# Patient Record
Sex: Female | Born: 1945 | Race: Asian | Hispanic: No | Marital: Married | Smoking: Never smoker
Health system: Southern US, Community
[De-identification: ages and names within clinical notes are randomized; demographics above are authoritative.]

## PROBLEM LIST (undated history)

## (undated) DIAGNOSIS — I10 Essential (primary) hypertension: Secondary | ICD-10-CM

## (undated) DIAGNOSIS — M1990 Unspecified osteoarthritis, unspecified site: Secondary | ICD-10-CM

## (undated) HISTORY — PX: NO PAST SURGERIES: SHX2092

---

## 2020-05-23 DIAGNOSIS — I639 Cerebral infarction, unspecified: Secondary | ICD-10-CM

## 2020-05-23 HISTORY — DX: Cerebral infarction, unspecified: I63.9

## 2020-06-12 DIAGNOSIS — I160 Hypertensive urgency: Secondary | ICD-10-CM

## 2020-06-12 DIAGNOSIS — I4510 Unspecified right bundle-branch block: Secondary | ICD-10-CM

## 2020-06-12 DIAGNOSIS — G459 Transient cerebral ischemic attack, unspecified: Secondary | ICD-10-CM

## 2020-06-12 DIAGNOSIS — I361 Nonrheumatic tricuspid (valve) insufficiency: Secondary | ICD-10-CM

## 2020-06-14 ENCOUNTER — Observation Stay (HOSPITAL_COMMUNITY)
Admission: EM | Admit: 2020-06-14 | Discharge: 2020-06-15 | DRG: 066 | Disposition: A | Discharge status: Home / Self Care | Payer: Medicaid Other | Source: Other Acute Inpatient Hospital | Attending: Internal Medicine | Admitting: Internal Medicine

## 2020-06-14 DIAGNOSIS — I6322 Cerebral infarction due to unspecified occlusion or stenosis of basilar arteries: Secondary | ICD-10-CM | POA: Diagnosis present

## 2020-06-14 DIAGNOSIS — R7303 Prediabetes: Secondary | ICD-10-CM | POA: Diagnosis not present

## 2020-06-14 DIAGNOSIS — I10 Essential (primary) hypertension: Secondary | ICD-10-CM

## 2020-06-14 DIAGNOSIS — Z8249 Family history of ischemic heart disease and other diseases of the circulatory system: Secondary | ICD-10-CM

## 2020-06-14 DIAGNOSIS — E785 Hyperlipidemia, unspecified: Secondary | ICD-10-CM | POA: Diagnosis present

## 2020-06-14 DIAGNOSIS — I4510 Unspecified right bundle-branch block: Secondary | ICD-10-CM | POA: Diagnosis not present

## 2020-06-14 DIAGNOSIS — I672 Cerebral atherosclerosis: Secondary | ICD-10-CM | POA: Diagnosis present

## 2020-06-14 DIAGNOSIS — R29700 NIHSS score 0: Secondary | ICD-10-CM | POA: Diagnosis not present

## 2020-06-14 DIAGNOSIS — Z20822 Contact with and (suspected) exposure to covid-19: Secondary | ICD-10-CM | POA: Diagnosis not present

## 2020-06-14 DIAGNOSIS — I651 Occlusion and stenosis of basilar artery: Secondary | ICD-10-CM

## 2020-06-14 DIAGNOSIS — I639 Cerebral infarction, unspecified: Secondary | ICD-10-CM

## 2020-06-14 HISTORY — DX: Unspecified osteoarthritis, unspecified site: M19.90

## 2020-06-14 HISTORY — DX: Essential (primary) hypertension: I10

## 2020-06-14 NOTE — Progress Notes (Signed)
Patient arrived in the unit at 2223 pm from [facility] ,escorted by [facility] staffs in stretcher, alert and oriented x4.

## 2020-06-14 NOTE — Progress Notes (Signed)
Patient passed [facility] stroke swallow eval.

## 2020-06-15 ENCOUNTER — Encounter (HOSPITAL_COMMUNITY): Payer: Self-pay | Admitting: Internal Medicine

## 2020-06-15 ENCOUNTER — Inpatient Hospital Stay (HOSPITAL_COMMUNITY): Payer: Medicaid Other

## 2020-06-15 DIAGNOSIS — I6322 Cerebral infarction due to unspecified occlusion or stenosis of basilar arteries: Secondary | ICD-10-CM | POA: Diagnosis present

## 2020-06-15 DIAGNOSIS — I10 Essential (primary) hypertension: Secondary | ICD-10-CM

## 2020-06-15 DIAGNOSIS — Z20822 Contact with and (suspected) exposure to covid-19: Secondary | ICD-10-CM | POA: Diagnosis not present

## 2020-06-15 DIAGNOSIS — I651 Occlusion and stenosis of basilar artery: Secondary | ICD-10-CM

## 2020-06-15 DIAGNOSIS — R7303 Prediabetes: Secondary | ICD-10-CM | POA: Diagnosis not present

## 2020-06-15 DIAGNOSIS — Z8249 Family history of ischemic heart disease and other diseases of the circulatory system: Secondary | ICD-10-CM | POA: Diagnosis not present

## 2020-06-15 DIAGNOSIS — E7800 Pure hypercholesterolemia, unspecified: Secondary | ICD-10-CM

## 2020-06-15 DIAGNOSIS — I639 Cerebral infarction, unspecified: Secondary | ICD-10-CM

## 2020-06-15 DIAGNOSIS — E785 Hyperlipidemia, unspecified: Secondary | ICD-10-CM | POA: Diagnosis not present

## 2020-06-15 DIAGNOSIS — I672 Cerebral atherosclerosis: Secondary | ICD-10-CM | POA: Diagnosis not present

## 2020-06-15 DIAGNOSIS — I4510 Unspecified right bundle-branch block: Secondary | ICD-10-CM | POA: Diagnosis not present

## 2020-06-15 DIAGNOSIS — R29700 NIHSS score 0: Secondary | ICD-10-CM | POA: Diagnosis not present

## 2020-06-15 HISTORY — PX: IR US GUIDE VASC ACCESS RIGHT: IMG2390

## 2020-06-15 HISTORY — PX: IR ANGIO VERTEBRAL SEL SUBCLAVIAN INNOMINATE BILAT MOD SED: IMG5366

## 2020-06-15 HISTORY — PX: IR ANGIO VERTEBRAL SEL SUBCLAVIAN INNOMINATE UNI L MOD SED: IMG5364

## 2020-06-15 HISTORY — PX: IR ANGIO INTRA EXTRACRAN SEL COM CAROTID INNOMINATE BILAT MOD SED: IMG5360

## 2020-06-15 LAB — CBC WITH DIFFERENTIAL/PLATELET
Abs Immature Granulocytes: 0.02 10*3/uL (ref 0.00–0.07)
Basophils Absolute: 0.1 10*3/uL (ref 0.0–0.1)
Basophils Relative: 1 %
Eosinophils Absolute: 0.3 10*3/uL (ref 0.0–0.5)
Eosinophils Relative: 3 %
HCT: 41.3 % (ref 36.0–46.0)
Hemoglobin: 13.5 g/dL (ref 12.0–15.0)
Immature Granulocytes: 0 %
Lymphocytes Relative: 27 %
Lymphs Abs: 2.4 10*3/uL (ref 0.7–4.0)
MCH: 30.3 pg (ref 26.0–34.0)
MCHC: 32.7 g/dL (ref 30.0–36.0)
MCV: 92.6 fL (ref 80.0–100.0)
Monocytes Absolute: 0.7 10*3/uL (ref 0.1–1.0)
Monocytes Relative: 8 %
Neutro Abs: 5.4 10*3/uL (ref 1.7–7.7)
Neutrophils Relative %: 61 %
Platelets: 211 10*3/uL (ref 150–400)
RBC: 4.46 MIL/uL (ref 3.87–5.11)
RDW: 12.2 % (ref 11.5–15.5)
WBC: 8.9 10*3/uL (ref 4.0–10.5)
nRBC: 0 % (ref 0.0–0.2)

## 2020-06-15 LAB — HEMOGLOBIN A1C
Hgb A1c MFr Bld: 5.9 % — ABNORMAL HIGH (ref 4.8–5.6)
Mean Plasma Glucose: 122.63 mg/dL

## 2020-06-15 LAB — COMPREHENSIVE METABOLIC PANEL
ALT: 17 U/L (ref 0–44)
AST: 17 U/L (ref 15–41)
Albumin: 3.5 g/dL (ref 3.5–5.0)
Alkaline Phosphatase: 80 U/L (ref 38–126)
Anion gap: 10 (ref 5–15)
BUN: 8 mg/dL (ref 8–23)
CO2: 24 mmol/L (ref 22–32)
Calcium: 9.2 mg/dL (ref 8.9–10.3)
Chloride: 106 mmol/L (ref 98–111)
Creatinine, Ser: 0.66 mg/dL (ref 0.44–1.00)
GFR calc Af Amer: >60 mL/min (ref >60)
GFR calc non Af Amer: >60 mL/min (ref >60)
Glucose, Bld: 113 mg/dL — ABNORMAL HIGH (ref 70–99)
Potassium: 3.5 mmol/L (ref 3.5–5.1)
Sodium: 140 mmol/L (ref 135–145)
Total Bilirubin: 0.8 mg/dL (ref 0.3–1.2)
Total Protein: 7.3 g/dL (ref 6.5–8.1)

## 2020-06-15 LAB — SURGICAL PCR SCREEN
MRSA, PCR: NEGATIVE
Staphylococcus aureus: NEGATIVE

## 2020-06-15 LAB — LIPID PANEL
Cholesterol: 191 mg/dL (ref 0–200)
HDL: 47 mg/dL (ref >40)
LDL Cholesterol: 99 mg/dL (ref 0–99)
Total CHOL/HDL Ratio: 4.1 RATIO
Triglycerides: 223 mg/dL — ABNORMAL HIGH (ref <150)
VLDL: 45 mg/dL — ABNORMAL HIGH (ref 0–40)

## 2020-06-15 LAB — PROTIME-INR
INR: 1 (ref 0.8–1.2)
Prothrombin Time: 12.7 seconds (ref 11.4–15.2)

## 2020-06-15 LAB — SARS CORONAVIRUS 2 BY RT PCR (HOSPITAL ORDER, PERFORMED IN ~~LOC~~ HOSPITAL LAB): SARS Coronavirus 2: NEGATIVE

## 2020-06-15 MED ORDER — METOPROLOL TARTRATE 25 MG PO TABS
25.0000 mg | ORAL_TABLET | Freq: Two times a day (BID) | ORAL | Status: DC
  Administered 2020-06-15: 25 mg via ORAL
  Filled 2020-06-15: qty 1

## 2020-06-15 MED ORDER — SODIUM CHLORIDE 0.9 % IV SOLN: INTRAVENOUS | Status: DC

## 2020-06-15 MED ORDER — STROKE: EARLY STAGES OF RECOVERY BOOK
Freq: Once | Status: AC
  Filled 2020-06-15: qty 1

## 2020-06-15 MED ORDER — ACETAMINOPHEN 650 MG RE SUPP: 650.0000 mg | RECTAL | Status: DC | PRN

## 2020-06-15 MED ORDER — NITROGLYCERIN 1 MG/10 ML FOR IR/CATH LAB
INTRA_ARTERIAL | Status: AC
  Filled 2020-06-15: qty 10

## 2020-06-15 MED ORDER — VERAPAMIL HCL 2.5 MG/ML IV SOLN: INTRA_ARTERIAL | Status: AC | PRN

## 2020-06-15 MED ORDER — ATORVASTATIN CALCIUM 80 MG PO TABS: 80.0000 mg | ORAL_TABLET | Freq: Every day | ORAL | 3 refills | Status: DC

## 2020-06-15 MED ORDER — MIDAZOLAM HCL 2 MG/2ML IJ SOLN
INTRAMUSCULAR | Status: AC
  Filled 2020-06-15: qty 2

## 2020-06-15 MED ORDER — CLOPIDOGREL BISULFATE 75 MG PO TABS
300.0000 mg | ORAL_TABLET | Freq: Once | ORAL | Status: AC
  Administered 2020-06-15: 300 mg via ORAL
  Filled 2020-06-15: qty 4

## 2020-06-15 MED ORDER — ASPIRIN EC 325 MG PO TBEC: 325.0000 mg | DELAYED_RELEASE_TABLET | Freq: Every day | ORAL | Status: DC

## 2020-06-15 MED ORDER — LIDOCAINE HCL 1 % IJ SOLN
INTRAMUSCULAR | Status: AC
  Filled 2020-06-15: qty 20

## 2020-06-15 MED ORDER — ASPIRIN 325 MG PO TBEC: 325.0000 mg | DELAYED_RELEASE_TABLET | Freq: Every day | ORAL | 3 refills | Status: DC

## 2020-06-15 MED ORDER — HEPARIN SODIUM (PORCINE) 1000 UNIT/ML IJ SOLN
INTRAMUSCULAR | Status: AC
  Filled 2020-06-15: qty 1

## 2020-06-15 MED ORDER — CLOPIDOGREL BISULFATE 75 MG PO TABS: 75.0000 mg | ORAL_TABLET | Freq: Every day | ORAL | Status: DC

## 2020-06-15 MED ORDER — ATORVASTATIN CALCIUM 80 MG PO TABS
80.0000 mg | ORAL_TABLET | Freq: Every day | ORAL | Status: DC
  Administered 2020-06-15: 80 mg via ORAL
  Filled 2020-06-15: qty 1

## 2020-06-15 MED ORDER — SODIUM CHLORIDE 0.9 % IV SOLN: INTRAVENOUS | Status: AC

## 2020-06-15 MED ORDER — IOHEXOL 300 MG/ML  SOLN
50.0000 mL | Freq: Once | INTRAMUSCULAR | Status: AC | PRN
  Administered 2020-06-15: 4 mL via INTRA_ARTERIAL

## 2020-06-15 MED ORDER — ACETAMINOPHEN 325 MG PO TABS: 650.0000 mg | ORAL_TABLET | ORAL | Status: DC | PRN

## 2020-06-15 MED ORDER — FENTANYL CITRATE (PF) 100 MCG/2ML IJ SOLN
INTRAMUSCULAR | Status: AC
  Filled 2020-06-15: qty 2

## 2020-06-15 MED ORDER — ASPIRIN EC 81 MG PO TBEC: 81.0000 mg | DELAYED_RELEASE_TABLET | Freq: Every day | ORAL | Status: DC

## 2020-06-15 MED ORDER — CLOPIDOGREL BISULFATE 75 MG PO TABS: 75.0000 mg | ORAL_TABLET | Freq: Every day | ORAL | 0 refills | Status: AC

## 2020-06-15 MED ORDER — MIDAZOLAM HCL 2 MG/2ML IJ SOLN
INTRAMUSCULAR | Status: AC | PRN
  Administered 2020-06-15: 1 mg via INTRAVENOUS

## 2020-06-15 MED ORDER — FENTANYL CITRATE (PF) 100 MCG/2ML IJ SOLN
INTRAMUSCULAR | Status: AC | PRN
  Administered 2020-06-15: 25 ug via INTRAVENOUS

## 2020-06-15 MED ORDER — HYDRALAZINE HCL 20 MG/ML IJ SOLN: 10.0000 mg | INTRAMUSCULAR | Status: DC | PRN

## 2020-06-15 MED ORDER — VERAPAMIL HCL 2.5 MG/ML IV SOLN
INTRAVENOUS | Status: AC
  Filled 2020-06-15: qty 2

## 2020-06-15 MED ORDER — ASPIRIN EC 325 MG PO TBEC
325.0000 mg | DELAYED_RELEASE_TABLET | Freq: Every day | ORAL | Status: DC
  Administered 2020-06-15: 325 mg via ORAL
  Filled 2020-06-15: qty 1

## 2020-06-15 MED ORDER — ENOXAPARIN SODIUM 40 MG/0.4ML ~~LOC~~ SOLN
40.0000 mg | Freq: Every day | SUBCUTANEOUS | Status: DC
  Filled 2020-06-15: qty 0.4

## 2020-06-15 MED ORDER — ACETAMINOPHEN 160 MG/5ML PO SOLN: 650.0000 mg | ORAL | Status: DC | PRN

## 2020-06-15 MED ORDER — IOHEXOL 300 MG/ML  SOLN
150.0000 mL | Freq: Once | INTRAMUSCULAR | Status: AC | PRN
  Administered 2020-06-15: 80 mL via INTRA_ARTERIAL

## 2020-06-15 MED ORDER — LIDOCAINE HCL (PF) 1 % IJ SOLN
INTRAMUSCULAR | Status: AC | PRN
  Administered 2020-06-15: 10 mL

## 2020-06-15 NOTE — Progress Notes (Signed)
TR band removed and clean, sterile dsg applied per order and protocol. Pt VSS. Will continue to closely monitor. Pt education completed with pt daughter at bedside. PLeotis Pain Awad Gladd RN   06/15/20 1600  Vitals  Temp 98 F (36.7 C)  Temp Source Oral  BP (!) 144/73  MAP (mmHg) 93  BP Location Left Arm  BP Method Automatic  Patient Position (if appropriate) Lying  Pulse Rate 77  Pulse Rate Source Dinamap  Resp 18  Level of Consciousness  Level of Consciousness Alert  Oxygen Therapy  SpO2 98 %  O2 Device Room Air  MEWS Score  MEWS Temp 0  MEWS Systolic 0  MEWS Pulse 0  MEWS RR 0  MEWS LOC 0  MEWS Score 0  MEWS Score Color Green

## 2020-06-15 NOTE — Progress Notes (Signed)
r wrist assessed at bedside with RN

## 2020-06-15 NOTE — Discharge Summary (Signed)
Physician Discharge Summary   Patient ID: Andrea Maxwell MRN: [id number] DOB/AGE: 1946/12/08 74 y.o.  Admit date: 06/14/2020 Discharge date: 06/15/2020  Primary Care Physician:  No primary care provider on file.   Recommendations for Outpatient Follow-up:  1. Follow up with PCP in 1-2 weeks 2. Outpatient follow-up with neurology in 4 weeks, ambulatory referral sent 3. Per neurology, Dr. Roda Shutters, patient has scheduled angioplasty +/- stenting next Tuesday on 06/20/2020 4. Continue aspirin, Plavix for 3 months then aspirin alone indefinitely  Home Health: Patient back to her baseline, seen by PT Equipment/Devices:   Discharge Condition: stable  CODE STATUS: FULL   Diet recommendation: Heart healthy diet   Discharge Diagnoses:    . 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 Essential hypertension High-grade basilar artery stenosis Hyperlipidemia   Consults:   Neurology, stroke service, Dr. Roda Shutters Neuro interventional radiology, Dr. Sharin Grave   Allergies:  No Known Allergies   DISCHARGE MEDICATIONS: Allergies as of 06/15/2020   No Known Allergies     Medication List    TAKE these medications   aspirin 325 MG EC tablet Take 1 tablet (325 mg total) by mouth daily. Start taking on: June 16, 2020   atorvastatin 80 MG tablet Commonly known as: LIPITOR Take 1 tablet (80 mg total) by mouth daily. Start taking on: June 16, 2020   clopidogrel 75 MG tablet Commonly known as: PLAVIX Take 1 tablet (75 mg total) by mouth daily. Start taking on: June 16, 2020   FISH OIL PO Take 1 capsule by mouth in the morning and at bedtime.   lisinopril 20 MG tablet Commonly known as: ZESTRIL Take 20 mg by mouth daily.   metoprolol tartrate 25 MG tablet Commonly known as: LOPRESSOR Take 25 mg by mouth 2 (two) times daily.            Discharge Care Instructions  (From admission, onward)         Start     Ordered   06/15/20 0000  If the dressing is still  on your incision site when you go home, remove it on the third day after your surgery date. Remove dressing if it begins to fall off, or if it is dirty or damaged before the third day.        06/15/20 1523           Brief H and P: For complete details please refer to admission H and P, but in brief *Andrea Maxwell a 73 y.o.femalewithhistory of hypertension prediabetes who was admitted to [facility] on June 12, 2020 2 days ago for stroke work-up. Patient at that time had symptoms of perioral numbness. Per report patient had come back from her yard and while having dinner with the family started developing perioral numbness and became unresponsive with staring spells. The whole incident lasted for 5 minutes. Has had a similar incident last year. Patient was brought to the ER and was found to be nonfocal CT head was unremarkable subsequent MRI brain showed right cerebellar acute/subacute infarct. Carotid Doppler was unremarkable 2D echo showed normal EF. EKG was showing normal sinus rhythm with RBBB. CBC metabolic panel were unremarkable I do not have the access to patient's lipid panel and hemoglobin A1c. Subsequent which patient had a CT angiogram of the head showed no significant hemodynamic stenosis in the CT angiogram of the neck but did show multifocal stenosis intracranially. This was discussed with neurologist at [facility] and was planning to transfer to [facility]  for possible intervention. At the time of my exam patient appears nonfocal still complains of perioral numbness. Patient was placed on aspirin Plavix and statins at [facility] Course:   Acute CVA (cerebrovascular accident) [facility]) -Patient presented to [facility] [address] with perioral numbness, unresponsive with staring spells. -MRI brain showed right cerebellar acute/subacute infarct. -Carotid Dopplers, unremarkable, 2D echo showed normal EF, no LV thrombus and no defect in the ED interatrial or  interventricular septum -CT angiogram head and neck showed high-grade basilar stenosis. Patient was transferred to [facility] for neuro interventional evaluation -Neurology consulted, patient was started on aspirin, Plavix, statin at [facility],  -LDL 99, hemoglobin A1c 5.9 -Neurology recommended aspirin 325 mg daily and Plavix 75 mg daily after loading dose, continue dual antiplatelet agents for 3 months then aspirin alone -Patient was seen by PT, she is back to her baseline, no PT needed -There was a question of seizure at the time of presentation, EEG at [facility] was normal -Per neurology, this is felt to be due to BB insufficiency and not seizure, Keppra was started at [facility], now off  High-grade basilar artery stenosis CTA head and neck proximal basilar high-grade focal stenosis Cerebral angiogram showed proximal be a preocclusive stenosis Continue aspirin and Plavix, plan for angioplasty with possible stenting next Tuesday by Dr. Corliss Skains Patient was cleared by neurology to be discharged home and return for her procedure on Tuesday     Essential hypertension -BP currently stable, continue metoprolol, lisinopril  Hyperlipidemia Continue statin, LDL goal <70  Day of Discharge S: No acute complaints.  Overall improving.  Cleared by neurology to be discharged home  BP 123/78 (BP Location: Left Arm)   Pulse 78   Temp 98.1 F (36.7 C) (Oral)   Resp 18   SpO2 98%   Physical Exam: General: Alert and awake oriented x3 not in any acute distress. HEENT: anicteric sclera, pupils reactive to light and accommodation CVS: S1-S2 clear no murmur rubs or gallops Chest: clear to auscultation bilaterally, no wheezing rales or rhonchi Abdomen: soft nontender, nondistended, normal bowel sounds Extremities: no cyanosis, clubbing or edema noted bilaterally Neuro: No new deficits    Get Medicines reviewed and adjusted: Please take all your medications with you for your  next visit with your Primary MD  Please request your Primary MD to go over all hospital tests and procedure/radiological results at the follow up. Please ask your Primary MD to get all Hospital records sent to his/her office.  If you experience worsening of your admission symptoms, develop shortness of breath, life threatening emergency, suicidal or homicidal thoughts you must seek medical attention immediately by calling 911 or calling your MD immediately  if symptoms less severe.  You must read complete instructions/literature along with all the possible adverse reactions/side effects for all the Medicines you take and that have been prescribed to you. Take any new Medicines after you have completely understood and accept all the possible adverse reactions/side effects.   Do not drive when taking pain medications.   Do not take more than prescribed Pain, Sleep and Anxiety Medications  Special Instructions: If you have smoked or chewed Tobacco  in the last 2 yrs please stop smoking, stop any regular Alcohol  and or any Recreational drug use.  Wear Seat belts while driving.  Please note  You were cared for by a hospitalist during your hospital stay. Once you are discharged, your primary care physician will handle any further medical issues. Please note that  NO REFILLS for any discharge medications will be authorized once you are discharged, as it is imperative that you return to your primary care physician (or establish a relationship with a primary care physician if you do not have one) for your aftercare needs so that they can reassess your need for medications and monitor your lab values.   The results of significant diagnostics from this hospitalization (including imaging, microbiology, ancillary and laboratory) are listed below for reference.      Procedures/Studies:   No results found.    LAB RESULTS: Basic Metabolic Panel: Recent Labs  Lab 06/15/20 0011  NA 140  K 3.5   CL 106  CO2 24  GLUCOSE 113*  BUN 8  CREATININE 0.66  CALCIUM 9.2   Liver Function Tests: Recent Labs  Lab 06/15/20 0011  AST 17  ALT 17  ALKPHOS 80  BILITOT 0.8  PROT 7.3  ALBUMIN 3.5   No results for input(s): LIPASE, AMYLASE in the last 168 hours. No results for input(s): AMMONIA in the last 168 hours. CBC: Recent Labs  Lab 06/15/20 0011  WBC 8.9  NEUTROABS 5.4  HGB 13.5  HCT 41.3  MCV 92.6  PLT 211   Cardiac Enzymes: No results for input(s): CKTOTAL, CKMB, CKMBINDEX, TROPONINI in the last 168 hours. BNP: Invalid input(s): POCBNP CBG: No results for input(s): GLUCAP in the last 168 hours.     Disposition and Follow-up: Discharge Instructions    Ambulatory referral to Neurology   Complete by: As directed    An appointment is requested in approximately: 4 Week(s): CVA   Diet - low sodium heart healthy   Complete by: As directed    If the dressing is still on your incision site when you go home, remove it on the third day after your surgery date. Remove dressing if it begins to fall off, or if it is dirty or damaged before the third day.   Complete by: As directed    Increase activity slowly   Complete by: As directed        DISPOSITION: Home   DISCHARGE FOLLOW-UP  Follow-up Information    Micki Riley, MD. Schedule an appointment as soon as possible for a visit in 4 week(s).   Specialties: Neurology, Radiology Contact information: [address] [place] [address] [phone]                Time coordinating discharge:  35 minutes  Signed:   Thad Ranger M.D. [facility] 06/15/2020, 3:37 PM

## 2020-06-15 NOTE — Progress Notes (Signed)
CHG bath given 

## 2020-06-15 NOTE — Progress Notes (Signed)
OT Cancellation Note  Patient Details Name: Bodhi Stenglein MRN: [id number] DOB: 06/28/1946   Cancelled Treatment:    Reason Eval/Treat Not Completed: Patient at procedure or test/ unavailable. RN reports transport arriving for IR procedure. Will follow and see as able.  Zavien Clubb/OTS Jairus Tonne 06/15/2020, 10:28 AM

## 2020-06-15 NOTE — Consult Note (Addendum)
Neurology Consultation  Reason for Consult: Stroke, concern for seizure, multifocal intracranial stenosis-most concerning high-grade stenosis of the basilar artery. Referring Physician: Dr. Toniann Fail, [facility]  CC: Spells of decreased responsiveness and perioral numbness  History is obtained from: Chart review, patient  HPI: Andrea Maxwell is a 74 y.o. female past medical history of hypertension, presented to [facility] on 06/12/2020 with complaints of perioral numbness and an episode of where she had diminished responsiveness. According to the family, she was having dinner when the symptoms started on 06/12/2020.  She became blank stared and then unresponsive.  She reported that she was feeling dizzy prior to that.  This episode lasted for about 5 minutes before she came to.  There was no generalized tonic-clonic activity.  No shaking.  No bowel bladder incontinence.  She has had a similar episode last winter that is documented in the HPI.  She was also hypertensive with systolic blood pressures in the 170s to 180s range on arrival. She was evaluated at [facility] with CT head, and an EEG-EEG was normal.  CT head did not show any acute changes.  A code stroke was called when she had similar episode while in the hospital.  Telemedicine neurology evaluated the patient.  Noncontrast head CT was unremarkable.  CTA of the head and neck showed multifocal intracranial stenoses-more predominantly in the posterior circulation with the proximal basilar high-grade focal stenosis.  Also right PCA P1 P2 segment high-grade stenosis.  And moderate to severe left M2 stenosis.  This was followed by an MRI that showed a small acute to subacute right cerebellar infarct. Case was discussed with me over the phone night of 06/13/2020, and I recommended that the patient be admitted to a facility where advanced facility such as neuro interventional radiology are available for possible arteriogram.  Patient  was finally transferred today to [facility] and I was called to evaluate the patient. Patient does not report any current symptoms.  Says she feels much better and back to baseline. Denies chest pain nausea vomiting shortness of breath.  At [facility], she was admitted and treated for possible seizure, possible stroke as well as hypertensive urgency. She was started on dual antiplatelets for the intracranial atherosclerosis and multifocal stenosis and Keppra for episodes of unresponsiveness concerning for focal seizures. After all the vessel imaging was obtained-transferred to [facility] for higher level of care.  LKW: Time on 06/12/2020 tpa given?: no, outside the window Premorbid modified Rankin scale (mRS): 1  ROS: Performed and negative except as noted in HPI  Past Medical History:  Diagnosis Date  . Arthritis   . Hypertension     Family History  Problem Relation Age of Onset  . Hypertension Sister    Social History:   reports that she has never smoked. She has never used smokeless tobacco. She reports that she does not drink alcohol. No history on file for drug use.  Medications  Current Facility-Administered Medications:  .  0.9 %  sodium chloride infusion, , Intravenous, Continuous, Eduard Clos, MD, Last Rate: 75 mL/hr at 06/15/20 0112, New Bag at 06/15/20 0112 .  acetaminophen (TYLENOL) tablet 650 mg, 650 mg, Oral, Q4H PRN **OR** acetaminophen (TYLENOL) 160 MG/5ML solution 650 mg, 650 mg, Per Tube, Q4H PRN **OR** acetaminophen (TYLENOL) suppository 650 mg, 650 mg, Rectal, Q4H PRN, Eduard Clos, MD .  aspirin EC tablet 81 mg, 81 mg, Oral, Daily, Midge Minium N, MD .  atorvastatin (LIPITOR) tablet 80 mg, 80  mg, Oral, Daily, Eduard Clos, MD .  clopidogrel (PLAVIX) tablet 75 mg, 75 mg, Oral, Daily, Eduard Clos, MD .  enoxaparin (LOVENOX) injection 40 mg, 40 mg, Subcutaneous, Daily, Eduard Clos, MD .  hydrALAZINE  (APRESOLINE) injection 10 mg, 10 mg, Intravenous, Q4H PRN, Eduard Clos, MD  Exam: Current vital signs: BP (!) 150/87 (BP Location: Left Arm)   Pulse 87   Temp 97.9 F (36.6 C) (Oral)   Resp (!) 25   SpO2 98%  Vital signs in last 24 hours: Temp:  [97.9 F (36.6 C)-98.2 F (36.8 C)] 97.9 F (36.6 C) (07/01 0007) Pulse Rate:  [87-90] 87 (07/01 0007) Resp:  [18-25] 25 (07/01 0007) BP: (144-150)/(85-87) 150/87 (07/01 0007) SpO2:  [97 %-98 %] 98 % (07/01 0007) General: Awake alert in no distress HEENT: Normocephalic atraumatic dry mucous membranes, no lymphadenopathy Lungs: Clear to auscultation with no wheezing Cardiovascular: Regular rate rhythm with no murmur rub gallop, equal pulses bilaterally. Abdomen: Soft nondistended nontender with normal active bowel sounds. Extremities appear warm well perfused with no edema Neurological exam Mental status: Awake alert oriented x3. Language: Speech is not dysarthric, naming comprehension repetition fluency are preserved. Cranial nerve examination: Pupils equal round react to light and brisk, extraocular movements intact, visual fields full, no facial asymmetry, facial sensation intact, hearing intact, tongue uvula soft palate are midline.  Normal sternocleidomastoid and trapezius muscle strength. Motor exam: Antigravity 5/5 in all 4 extremities. Sensory exam: Intact light touch all over without extinction Coordination: Intact finger-nose-finger testing bilaterally. NIH stroke scale-0  Labs I have reviewed labs in epic and the results pertinent to this consultation are: CBC    Component Value Date/Time   WBC 8.9 06/15/2020 0011   RBC 4.46 06/15/2020 0011   HGB 13.5 06/15/2020 0011   HCT 41.3 06/15/2020 0011   PLT 211 06/15/2020 0011   MCV 92.6 06/15/2020 0011   MCH 30.3 06/15/2020 0011   MCHC 32.7 06/15/2020 0011   RDW 12.2 06/15/2020 0011   LYMPHSABS 2.4 06/15/2020 0011   MONOABS 0.7 06/15/2020 0011   EOSABS 0.3  06/15/2020 0011   BASOSABS 0.1 06/15/2020 0011    CMP     Component Value Date/Time   NA 140 06/15/2020 0011   K 3.5 06/15/2020 0011   CL 106 06/15/2020 0011   CO2 24 06/15/2020 0011   GLUCOSE 113 (H) 06/15/2020 0011   BUN 8 06/15/2020 0011   CREATININE 0.66 06/15/2020 0011   CALCIUM 9.2 06/15/2020 0011   PROT 7.3 06/15/2020 0011   ALBUMIN 3.5 06/15/2020 0011   AST 17 06/15/2020 0011   ALT 17 06/15/2020 0011   ALKPHOS 80 06/15/2020 0011   BILITOT 0.8 06/15/2020 0011   GFRNONAA >60 06/15/2020 0011   GFRAA >60 06/15/2020 0011    Lipid Panel     Component Value Date/Time   CHOL 191 06/15/2020 0011   TRIG 223 (H) 06/15/2020 0011   HDL 47 06/15/2020 0011   CHOLHDL 4.1 06/15/2020 0011   VLDL 45 (H) 06/15/2020 0011   LDLCALC 99 06/15/2020 0011   Labs at [facility] CBC, mild hyperglycemia on BMP, PT/INR normal.  Imaging I have reviewed the images obtained:  CT-scan of the brain-no acute changes MRI brain-small acute to subacute right cerebellar infarct. CTA head and neck with moderate to severe left M2 stenosis, right PCA poorly visualized with right P1/P2 segment high-grade stenosis.  Proximal basilar stenosis which is high-grade along with atherosclerotic changes.  2D echocardiogram done  at [facility] shows sinus rhythm, normal LV function, no LV thrombus and no defect in the interatrial or interventricular septum.  Carotid Dopplers showed heterogeneous minimal plaque in extracranial circulation.  Anterograde vertebral flow.  EEG: Normal.  Assessment: 74 year old woman with past medical history of hypertension presented to an outside hospital for an episode of diminished responsiveness perioral tingling and numbness and symptoms concerning for possible seizure episode. Had another episode in the hospital.  Started on Keppra. Imaging revealed high-grade basilar stenosis and a acute to subacute right cerebellar infarct. Given significant amount  of stenosis in the posterior circulation, her episodes of decreased responsiveness might be related to vertebrobasilar insufficiency rather than seizures and that needs to be evaluated further. She was transferred to [facility] for higher level of care.  Not a candidate for TPA here due to being outside the window.  Earlier evaluation was performed by telemedicine neurology at the other hospital -please see outside paper records in the patient's chart.  Not deemed to be a candidate for emergent thrombectomy due to NIH being 0 but definitely would require further looking into the basilar stenosis.  Impression: --Posterior circulation stroke-involving the right cerebellum-atheroembolic versus small vessel --Episodes of unresponsiveness and fluctuating perioral numbness-concerning for seizure activity versus possible vertebrobasilar insufficiency given severe basilar stenosis.  EEG at the outside facility was completely normal.   Recommendations: Continue telemetry Keep n.p.o.-might need diagnostic cerebral and cervical arteriogram tomorrow with interventional neuroradiology. IR consult has been placed-Dr Corliss Skains for AM. Has completed 2D echocardiogram-no need to repeat.  Continue dual antiplatelets Continue high-dose statin I would continue Keppra for now but I suspect that the symptoms are probably related to vertebrobasilar insufficiency.  Will defer continuation decision after full work-up has been completed. Continue frequent neurochecks Allow for permissive hypertension for another day or so before starting to normalize blood pressures. Discussed with Dr. Toniann Fail. Stroke team will follow.  -- Milon Dikes, MD [facility] Pager: [phone] If 7pm to 7am, please call on call as listed on AMION.

## 2020-06-15 NOTE — Plan of Care (Signed)

## 2020-06-15 NOTE — Progress Notes (Signed)
[facility]                                                                              Patient Demographics  Andrea Maxwell, is a 74 y.o. female, DOB - 16-Sep-1946, XBL:[id number]  Admit date - 06/14/2020   Admitting Physician Eduard Clos, MD  Outpatient Primary MD for the patient is No primary care provider on file.  Outpatient specialists:   LOS - 1  days   Medical records reviewed and are as summarized below:    No chief complaint on file.      Brief summary    Andrea Maxwell is a 74 y.o. female with history of hypertension prediabetes who was admitted to [facility] on June 12, 2020 2 days ago for stroke work-up.  Patient at that time had symptoms of perioral numbness.  Per report patient had come back from her yard and while having dinner with the family started developing perioral numbness and became unresponsive with staring spells.  The whole incident lasted for 5 minutes.  Has had a similar incident last year.  Patient was brought to the ER and was found to be nonfocal CT head was unremarkable subsequent MRI brain showed right cerebellar acute/subacute infarct.  Carotid Doppler was unremarkable 2D echo showed normal EF.  EKG was showing normal sinus rhythm with RBBB.  CBC metabolic panel were unremarkable I do not have the access to patient's lipid panel and hemoglobin A1c.  Subsequent which patient had a CT angiogram of the head showed no significant hemodynamic stenosis in the CT angiogram of the neck but did show multifocal stenosis intracranially.  This was discussed with neurologist at [facility] and was planning to transfer to [facility] for possible intervention.  At the time of my exam patient appears nonfocal still complains of perioral numbness.  Patient was placed on aspirin Plavix and statins at [facility].    Assessment & Plan    Principal Problem:   Acute CVA (cerebrovascular accident) [facility]) -Patient presented to [facility] with perioral numbness, unresponsive with staring spells. -MRI brain showed right cerebellar acute/subacute infarct. 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 Patient was transferred to [facility] for neuro interventional evaluation -Neurology consulted, patient was started on aspirin, Plavix, statin at [facility], continue -N.p.o., IR consulted, diagnostic angiogram, will need further management, will follow -PT evaluation done, patient close to her baseline  Active Problems:   Essential hypertension -BP currently stable, continue metoprolol, verapamil   Code Status: Full CODE STATUS DVT Prophylaxis:  Lovenox  Family Communication: Discussed all imaging results, lab results, explained to the patient    Disposition Plan:     Status is: Inpatient  Remains inpatient appropriate because:Inpatient level of care appropriate due to severity of illness   Dispo: The patient is from: Home              Anticipated d/c is to: Home              Anticipated d/c date is: 2 days  Patient currently is not medically stable to d/c.      Time Spent in minutes 25 minutes  Procedures:  None  Consultants:   Neuro interventional radiology Neurology  Antimicrobials:   Anti-infectives (From admission, onward)   None          Medications  Scheduled Meds: . aspirin EC  325 mg Oral Daily  . atorvastatin  80 mg Oral Daily  . clopidogrel  75 mg Oral Daily  . enoxaparin (LOVENOX) injection  40 mg Subcutaneous Daily  . heparin sodium (porcine)      . lidocaine      . metoprolol tartrate  25 mg Oral BID  . nitroGLYCERIN      . verapamil       Continuous Infusions: . sodium chloride 75 mL/hr at 06/15/20 0112   PRN Meds:.acetaminophen **OR** acetaminophen (TYLENOL) oral liquid 160 mg/5 mL **OR** acetaminophen, fentaNYL, hydrALAZINE, iohexol, lidocaine (PF), midazolam, Radial Cocktail  (nitroglycerin/verapamil/heparin) for IR      Subjective:   Andrea Maxwell was seen and examined today.  Currently no acute complaints per the patient.  States she is feeling better and getting close to her baseline.  Patient denies dizziness, chest pain, shortness of breath, abdominal pain, N/V/D/C.  No new weakness or numbness or tingling overnight.  No fevers  Objective:   Vitals:   06/15/20 1150 06/15/20 1155 06/15/20 1200 06/15/20 1205  BP: (!) 154/78 115/74 124/76 125/76  Pulse: 75 (!) 107 93 79  Resp: (!) 21 (!) 22 18 20   Temp:      TempSrc:      SpO2: 100% 92% 94% 96%    Intake/Output Summary (Last 24 hours) at 06/15/2020 1210 Last data filed at 06/15/2020 0500 Gross per 24 hour  Intake 225 ml  Output --  Net 225 ml     Wt Readings from Last 3 Encounters:  No data found for Wt     Exam  General: Alert and oriented x 3, NAD  Cardiovascular: S1 S2 auscultated, no murmurs, RRR  Respiratory: Clear to auscultation bilaterally, no wheezing, rales or rhonchi  Gastrointestinal: Soft, nontender, nondistended, + bowel sounds  Ext: no pedal edema bilaterally  Neuro: AAOx3, Cr N's II- XII. Strength 5/5 upper and lower extremities bilaterally  Musculoskeletal: No digital cyanosis, clubbing  Skin: No rashes  Psych: Normal affect and demeanor, alert and oriented x3    Data Reviewed:  I have personally reviewed following labs and imaging studies  Micro Results Recent Results (from the past 240 hour(s))  SARS Coronavirus 2 by RT PCR (hospital order, performed in [facility] hospital lab) Nasopharyngeal Nasopharyngeal Swab     Status: None   Collection Time: 06/15/20  5:25 AM   Specimen: Nasopharyngeal Swab  Result Value Ref Range Status   SARS Coronavirus 2 NEGATIVE NEGATIVE Final    Comment: (NOTE) SARS-CoV-2 target nucleic acids are NOT DETECTED.  The SARS-CoV-2 RNA is generally detectable in upper and lower respiratory specimens during the acute phase  of infection. The lowest concentration of SARS-CoV-2 viral copies this assay can detect is 250 copies / mL. A negative result does not preclude SARS-CoV-2 infection and should not be used as the sole basis for treatment or other patient management decisions.  A negative result may occur with improper specimen collection / handling, submission of specimen other than nasopharyngeal swab, presence of viral mutation(s) within the areas targeted by this assay, and inadequate number of viral copies (<250 copies / mL). A negative result must  be combined with clinical observations, patient history, and epidemiological information.  Fact Sheet for Patients:   BoilerBrush.com.cy  Fact Sheet for Healthcare Providers: https://pope.com/  This test is not yet approved or  cleared by the [place] FDA and has been authorized for detection and/or diagnosis of SARS-CoV-2 by FDA under an Emergency Use Authorization (EUA).  This EUA will remain in effect (meaning this test can be used) for the duration of the COVID-19 declaration under Section 564(b)(1) of the Act, 21 U.S.C. section 360bbb-3(b)(1), unless the authorization is terminated or revoked sooner.  Performed at [facility], [address] [place], [place] [address]   Surgical pcr screen     Status: None   Collection Time: 06/15/20  5:26 AM   Specimen: Nasopharyngeal Swab; Nasal Swab  Result Value Ref Range Status   MRSA, PCR NEGATIVE NEGATIVE Final   Staphylococcus aureus NEGATIVE NEGATIVE Final    Comment: (NOTE) The Xpert SA Assay (FDA approved for NASAL specimens in patients 50 years of age and older), is one component of a comprehensive surveillance program. It is not intended to diagnose infection nor to guide or monitor treatment. Performed at [facility], [address] [place], [place] [address]     Radiology Reports No results found.  Lab  Data:  CBC: Recent Labs  Lab 06/15/20 0011  WBC 8.9  NEUTROABS 5.4  HGB 13.5  HCT 41.3  MCV 92.6  PLT 211   Basic Metabolic Panel: Recent Labs  Lab 06/15/20 0011  NA 140  K 3.5  CL 106  CO2 24  GLUCOSE 113*  BUN 8  CREATININE 0.66  CALCIUM 9.2   GFR: CrCl cannot be calculated (Unknown ideal weight.). Liver Function Tests: Recent Labs  Lab 06/15/20 0011  AST 17  ALT 17  ALKPHOS 80  BILITOT 0.8  PROT 7.3  ALBUMIN 3.5   No results for input(s): LIPASE, AMYLASE in the last 168 hours. No results for input(s): AMMONIA in the last 168 hours. Coagulation Profile: Recent Labs  Lab 06/15/20 0816  INR 1.0   Cardiac Enzymes: No results for input(s): CKTOTAL, CKMB, CKMBINDEX, TROPONINI in the last 168 hours. BNP (last 3 results) No results for input(s): PROBNP in the last 8760 hours. HbA1C: Recent Labs    06/15/20 0011  HGBA1C 5.9*   CBG: No results for input(s): GLUCAP in the last 168 hours. Lipid Profile: Recent Labs    06/15/20 0011  CHOL 191  HDL 47  LDLCALC 99  TRIG 223*  CHOLHDL 4.1   Thyroid Function Tests: No results for input(s): TSH, T4TOTAL, FREET4, T3FREE, THYROIDAB in the last 72 hours. Anemia Panel: No results for input(s): VITAMINB12, FOLATE, FERRITIN, TIBC, IRON, RETICCTPCT in the last 72 hours. Urine analysis: No results found for: COLORURINE, APPEARANCEUR, LABSPEC, PHURINE, GLUCOSEU, HGBUR, BILIRUBINUR, KETONESUR, PROTEINUR, UROBILINOGEN, NITRITE, LEUKOCYTESUR   Jase Himmelberger M.D. [facility] 06/15/2020, 12:10 PM   Call night coverage person covering after 7pm

## 2020-06-15 NOTE — Evaluation (Signed)
Physical Therapy Evaluation Patient Details Name: Andrea Maxwell MRN: [id number] DOB: 05/10/1946 Today's Date: 06/15/2020   History of Present Illness  74 y.o. female with PMH of hypertension prediabete presents to ED with perioral numbness and reported staring spell that lasted 5 minutes. MRI revealed R cerebellar acute/subacute infarct. admitted to [facility] 06/12/20 and transfered to [facility] 06/14/20 for treatment of multifocal intracranial stenosis  Clinical Impression  PTA pt living with her daughter and her family in multilevel home with 5 steps to enter. Pt bed/bath on 1st floor. Pt reports independence in ambulation, ADLs and iADLS, and is an avid gardener. Pt is currently limited in safe mobility by decreased balance. Pt is mod I for bed mobility and supervision for transfers and ambulation without AD. Transport present for transfer to IR so ambulation and balance assessment limited. Pt has no insurance and does not want to pay for HHPT. PT will work to provide HEP for discharge and perform stair training prior to discharge.     Follow Up Recommendations No PT follow up;Supervision for mobility/OOB    Equipment Recommendations  None recommended by PT       Precautions / Restrictions Precautions Precautions: Fall Precaution Comments: sycope prior to admission  Restrictions Weight Bearing Restrictions: No      Mobility  Bed Mobility Overal bed mobility: Modified Independent             General bed mobility comments: increased time and effort to especially for bringing trunk to upright and scooting hips to EoB  Transfers Overall transfer level: Needs assistance   Transfers: Sit to/from Stand;Stand Pivot Transfers Sit to Stand: Supervision Stand pivot transfers: Supervision       General transfer comment: supervision for safety, good power up and steadying  Ambulation/Gait Ambulation/Gait assistance: Supervision Gait Distance (Feet): 20  Feet Assistive device: None Gait Pattern/deviations: Step-through pattern;Decreased stride length Gait velocity: slowed   General Gait Details: supervision for slow, mildly unsteady gait, no LoB         Balance Overall balance assessment: Mild deficits observed, not formally tested                                           Pertinent Vitals/Pain Pain Assessment: 0-10 Pain Score: 6  Pain Location: L shoulder  Pain Descriptors / Indicators: Aching;Sore Pain Intervention(s): Limited activity within patient's tolerance;Monitored during session;Repositioned    Home Living Family/patient expects to be discharged to:: Private residence Living Arrangements: Children Available Help at Discharge: Family;Available PRN/intermittently Type of Home: House Home Access: Stairs to enter Entrance Stairs-Rails: Can reach both Entrance Stairs-Number of Steps: 5 Home Layout: Two level;Able to live on main level with bedroom/bathroom Home Equipment: None      Prior Function Level of Independence: Independent         Comments: avid gardener     Hand Dominance   Dominant Hand: Right    Extremity/Trunk Assessment   Upper Extremity Assessment Upper Extremity Assessment: Defer to OT evaluation    Lower Extremity Assessment Lower Extremity Assessment: RLE deficits/detail;LLE deficits/detail RLE Deficits / Details: ROM WFL/strength grossly 4/5 RLE Sensation: WNL RLE Coordination: WNL LLE Deficits / Details: ROM WFL/strength grossly 4/5 LLE Sensation: WNL LLE Coordination: WNL    Cervical / Trunk Assessment Cervical / Trunk Assessment: Kyphotic  Communication   Communication: Prefers language other than English ([place] understands most conversation daughter assists)  Cognition Arousal/Alertness: Awake/alert Behavior During Therapy: WFL for tasks assessed/performed Overall Cognitive Status: Within Functional Limits for tasks assessed                                         General Comments General comments (skin integrity, edema, etc.): VSS on RA, daughter in room         Assessment/Plan    PT Assessment Patient needs continued PT services  PT Problem List Decreased balance       PT Treatment Interventions Gait training;Stair training;Functional mobility training;Therapeutic activities;Therapeutic exercise;Balance training;Cognitive remediation;Patient/family education    PT Goals (Current goals can be found in the Care Plan section)  Acute Rehab PT Goals Patient Stated Goal: return to gardening PT Goal Formulation: With patient/family Time For Goal Achievement: 06/29/20 Potential to Achieve Goals: Good    Frequency Min 4X/week    AM-PAC PT "6 Clicks" Mobility  Outcome Measure Help needed turning from your back to your side while in a flat bed without using bedrails?: None Help needed moving from lying on your back to sitting on the side of a flat bed without using bedrails?: A Little Help needed moving to and from a bed to a chair (including a wheelchair)?: None Help needed standing up from a chair using your arms (e.g., wheelchair or bedside chair)?: None Help needed to walk in hospital room?: None Help needed climbing 3-5 steps with a railing? : A Little 6 Click Score: 22    End of Session Equipment Utilized During Treatment: Gait belt Activity Tolerance: Patient tolerated treatment well Patient left: Other (comment) (in wheelchair to go to IR) Nurse Communication: Mobility status PT Visit Diagnosis: Muscle weakness (generalized) (M62.81);Unsteadiness on feet (R26.81)    Time: 1884-1660 PT Time Calculation (min) (ACUTE ONLY): 29 min   Charges:   PT Evaluation $PT Eval Moderate Complexity: 1 Mod PT Treatments $Gait Training: 8-22 mins        Kimber Esterly B. Beverely Risen PT, DPT Acute Rehabilitation Services Pager [phone] Office [phone]   Elon Alas Fleet 06/15/2020, 10:48  AM

## 2020-06-15 NOTE — Progress Notes (Signed)
SLP Cancellation Note  Patient Details Name: Andrea Maxwell MRN: [id number] DOB: July 06, 1946   Cancelled treatment:       Reason Eval/Treat Not Completed: Patient at procedure or test/unavailable (Pt off unit for IR. SLP will follow up. )  Manisha Cancel I. Vear Clock, MS, CCC-SLP Acute Rehabilitation Services Office number [phone] Pager [phone]  Scheryl Marten 06/15/2020, 11:07 AM

## 2020-06-15 NOTE — Progress Notes (Signed)
STROKE TEAM PROGRESS NOTE   INTERVAL HISTORY Pt daughter at bedside. Pt back from IR suite. Discussed with Dr. Corliss Skains, pt has severe pre-occlusive BA and he will schedule angioplasty +/- stenting next Tuesday. Pt neuro stable. BP 150s  Vitals:   06/15/20 1205 06/15/20 1210 06/15/20 1215 06/15/20 1220  BP: 125/76 131/72 134/72 132/80  Pulse: 79 79 73 71  Resp: 20 20  20   Temp:      TempSrc:      SpO2: 96% 98% 100% 100%    CBC:  Recent Labs  Lab 06/15/20 0011  WBC 8.9  NEUTROABS 5.4  HGB 13.5  HCT 41.3  MCV 92.6  PLT 211    Basic Metabolic Panel:  Recent Labs  Lab 06/15/20 0011  NA 140  K 3.5  CL 106  CO2 24  GLUCOSE 113*  BUN 8  CREATININE 0.66  CALCIUM 9.2   Lipid Panel:     Component Value Date/Time   CHOL 191 06/15/2020 0011   TRIG 223 (H) 06/15/2020 0011   HDL 47 06/15/2020 0011   CHOLHDL 4.1 06/15/2020 0011   VLDL 45 (H) 06/15/2020 0011   LDLCALC 99 06/15/2020 0011   HgbA1c:  Lab Results  Component Value Date   HGBA1C 5.9 (H) 06/15/2020   Urine Drug Screen: No results found for: LABOPIA, COCAINSCRNUR, LABBENZ, AMPHETMU, THCU, LABBARB  Alcohol Level No results found for: ETH  IMAGING past 24 hours No results found.  PHYSICAL EXAM  Temp:  [97.6 F (36.4 C)-98.3 F (36.8 C)] 98.1 F (36.7 C) (07/01 1510) Pulse Rate:  [68-107] 68 (07/01 1510) Resp:  [15-25] 18 (07/01 1510) BP: (115-168)/(72-94) 154/85 (07/01 1510) SpO2:  [92 %-100 %] 98 % (07/01 1510)  General - Well nourished, well developed, in no apparent distress.  Ophthalmologic - fundi not visualized due to noncooperation.  Cardiovascular - Regular rhythm and rate.  Mental Status -  Level of arousal and orientation to time, place, and person were intact. Language including expression, naming, repetition, comprehension was assessed and found intact. Fund of Knowledge was assessed and was intact.  Cranial Nerves II - XII - II - Visual field intact OU. III, IV, VI -  Extraocular movements intact. V - Facial sensation intact bilaterally. VII - Facial movement intact bilaterally. VIII - Hearing & vestibular intact bilaterally. X - Palate elevates symmetrically. XI - Chin turning & shoulder shrug intact bilaterally. XII - Tongue protrusion intact.  Motor Strength - The patient's strength was normal in all extremities and pronator drift was absent.  Bulk was normal and fasciculations were absent.   Motor Tone - Muscle tone was assessed at the neck and appendages and was normal.  Reflexes - The patient's reflexes were symmetrical in all extremities and she had no pathological reflexes.  Sensory - Light touch, temperature/pinprick were assessed and were symmetrical.    Coordination - The patient had normal movements in the hands and feet with no ataxia or dysmetria.  Tremor was absent.  Gait and Station - deferred.   ASSESSMENT/PLAN Ms. Janashia Nickelson is a 74 y.o. female with history of hypertension, prediabetes, admitted to [facility] 06/12/20 w/ periorbital numbness, staring spells and decreased LOC, similar to episode 1 yr ago - found to have R cerebellar infarct, put on plavix, statin and Keppra for possible sz. CTA head w/ multifocal stenosis including high-grade BA stenosis.  Neuro worsening in hospital. Concern for VB insufficiency given altered responsiveness. Transferred to 06/14/20. [facility] for further evaluation.  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   Repeat CT head [facility]) no acute abnormality  CTA head & neck [facility]) multifocal intracranial stenoses-more predominantly in the posterior circulation with the proximal basilar high-grade focal stenosis.  right PCA P1 P2 segment high-grade stenosis.  moderate to severe left M2 stenosis.  MRI  [facility]) Small acute to  subacute small R cerebellar infarct   Carotid Doppler  [facility]) heterogeneous minimal plaque in extracranial circulation.  Anterograde vertebral flow  2D Echo [facility]) sinus rhythm, normal LV function, no LV thrombus and no defect in the interatrial or interventricular septum.   LDL 99  HgbA1c 5.9  Lovenox 40 mg sq daily for VTE prophylaxis  No antithrombotic prior to [facility] admission, now on aspirin 325 mg daily and clopidogrel 75 mg daily after loading dose. Continue DAPT x 3 months then aspirin alone.    Therapy recommendations:  No PT  Disposition:  pending   High-grade BA stenosis   CTA head & neck proximal basilar high-grade focal stenosis.    Cerebral angio proximal BA pre-occlusive stenosis  Plan for intervention next Tuesday with Dr. Corliss Skains  On ASA and plavix after loading  ?? Seizure   EEG [facility]) normal  on Keppra ar [facility]  Felt to be VB insufficiency, now off   Hypertension  Stable . BP goal 130-160 before intervention . Long-term BP goal 130-150 given PCA and MCA stenosis . Orthostatic vitals pending   Hyperlipidemia  Home meds:  No statin   Now on lipitor 80  LDL 99, goal < 70  Continue statin at discharge  Other Stroke Risk Factors  Advanced age  Hospital day # 1  Neurology will sign off. Please call with questions. Pt will follow up with stroke clinic NP at [facility] in about 4 weeks. Thanks for the consult.  Marvel Plan, MD PhD Stroke Neurology 06/15/2020 3:18 PM    To contact Stroke Continuity provider, please refer to [email or web address]. After hours, contact General Neurology

## 2020-06-15 NOTE — TOC Transition Note (Signed)
Transition of Care [facility]) - CM/SW Discharge Note   Patient Details  Name: Andrea Maxwell MRN: [id number] Date of Birth: 1946/09/11  Transition of Care [facility]) CM/SW Contact:  Kermit Balo, RN Phone Number: 06/15/2020, 3:40 PM   Clinical Narrative:    Pt is discharging home with self care. No f/u per PT and no DME needs.  Pt is without insurance. CM spoke to pt and daughter and pt has a PCP in [facility]. They want to continue with this PCP.  Cm provided them good rx card to assist with the cost of d/c medications. CM has emailed [employer] so they can f/u with the patient for medicaid. Daughter states the patient will have supervision at home.  Daughter providing transport home.   Final next level of care: Home/Self Care Barriers to Discharge: Inadequate or no insurance, Barriers Unresolved (comment)   Patient Goals and CMS Choice        Discharge Placement                       Discharge Plan and Services                                     Social Determinants of Health (SDOH) Interventions     Readmission Risk Interventions No flowsheet data found.

## 2020-06-15 NOTE — Procedures (Signed)
S/P 4 vessel cerebral artreriogram RT rad approach. Findings. 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 S.Shannin Naab MD.

## 2020-06-15 NOTE — Progress Notes (Signed)
Pt discharge education and instructions completed with pt and daughter Lawson Fiscal at bedside. Both voices understanding and denies any questions. Pt IV and telemetry removed. Pt to pick up electronically sent prescriptions from preferred pharmacy on file. pt discharge home with daughter to transport her home. Right Radial site remains level 0 with dsg unremarkable. Pt transported off unit via wheelchair with belongings to the side. Dionne Bucy RN

## 2020-06-15 NOTE — H&P (Signed)
History and Physical    Andrea Maxwell JIR:[id number] DOB: [birth year] DOA: 06/14/2020  PCP: No primary care provider on file.  Patient coming from: Patient was transferred from [facility].  Chief Complaint: Perioral numbness and loss of consciousness.  HPI: Andrea Maxwell is a 74 y.o. female with history of hypertension prediabetes who was admitted to [facility] on June 12, 2020 2 days ago for stroke work-up.  Patient at that time had symptoms of perioral numbness.  Per report patient had come back from her yard and while having dinner with the family started developing perioral numbness and became unresponsive with staring spells.  The whole incident lasted for 5 minutes.  Has had a similar incident last year.  Patient was brought to the ER and was found to be nonfocal CT head was unremarkable subsequent MRI brain showed right cerebellar acute/subacute infarct.  Carotid Doppler was unremarkable 2D echo showed normal EF.  EKG was showing normal sinus rhythm with RBBB.  CBC metabolic panel were unremarkable I do not have the access to patient's lipid panel and hemoglobin A1c.  Subsequent which patient had a CT angiogram of the head and neck which showed no significant hemodynamic stenosis in the CT angiogram of the neck but did show multifocal stenosis intracranially.  This was discussed with neurologist at [facility] and was planning to transfer to [facility] for possible intervention.  At the time of my exam patient appears nonfocal still complains of perioral numbness.  Patient was placed on aspirin Plavix and statins at [facility].  ED Course: Patient is a direct admit.  Review of Systems: As per HPI, rest all negative.   Past Medical History:  Diagnosis Date  . Arthritis   . Hypertension     History reviewed. No pertinent surgical history.   reports that she has never smoked. She has never used smokeless tobacco. She reports that she does not drink alcohol. No  history on file for drug use.  No Known Allergies  Family History  Problem Relation Age of Onset  . Hypertension Sister     Prior to Admission medications   Not on File    Physical Exam: Constitutional: Moderately built and nourished. Vitals:   06/14/20 2245 06/15/20 0007  BP: (!) 144/85 (!) 150/87  Pulse: 90 87  Resp: 18 (!) 25  Temp: 98.2 F (36.8 C) 97.9 F (36.6 C)  TempSrc: Oral Oral  SpO2: 97% 98%   Eyes: Anicteric no pallor. ENMT: No discharge from the ears eyes nose or mouth. Neck: No mass felt.  No neck rigidity. Respiratory: No rhonchi or crepitations. Cardiovascular: S1-S2 heard. Abdomen: Soft nontender bowel sounds present. Musculoskeletal: No edema. Skin: No rash. Neurologic: Alert awake oriented time place and person.  Moves all extremities 5 x 5.  No facial asymmetry tongue is midline pupils equal and reacting to light. Psychiatric: Appears normal.   Labs on Admission: I have personally reviewed following labs and imaging studies  CBC: Recent Labs  Lab 06/15/20 0011  WBC 8.9  NEUTROABS 5.4  HGB 13.5  HCT 41.3  MCV 92.6  PLT 211   Basic Metabolic Panel: Recent Labs  Lab 06/15/20 0011  NA 140  K 3.5  CL 106  CO2 24  GLUCOSE 113*  BUN 8  CREATININE 0.66  CALCIUM 9.2   GFR: CrCl cannot be calculated (Unknown ideal weight.). Liver Function Tests: Recent Labs  Lab 06/15/20 0011  AST 17  ALT 17  ALKPHOS 80  BILITOT 0.8  PROT 7.3  ALBUMIN 3.5   No results for input(s): LIPASE, AMYLASE in the last 168 hours. No results for input(s): AMMONIA in the last 168 hours. Coagulation Profile: No results for input(s): INR, PROTIME in the last 168 hours. Cardiac Enzymes: No results for input(s): CKTOTAL, CKMB, CKMBINDEX, TROPONINI in the last 168 hours. BNP (last 3 results) No results for input(s): PROBNP in the last 8760 hours. HbA1C: Recent Labs    06/15/20 0011  HGBA1C 5.9*   CBG: No results for input(s): GLUCAP in the last 168  hours. Lipid Profile: No results for input(s): CHOL, HDL, LDLCALC, TRIG, CHOLHDL, LDLDIRECT in the last 72 hours. Thyroid Function Tests: No results for input(s): TSH, T4TOTAL, FREET4, T3FREE, THYROIDAB in the last 72 hours. Anemia Panel: No results for input(s): VITAMINB12, FOLATE, FERRITIN, TIBC, IRON, RETICCTPCT in the last 72 hours. Urine analysis: No results found for: COLORURINE, APPEARANCEUR, LABSPEC, PHURINE, GLUCOSEU, HGBUR, BILIRUBINUR, KETONESUR, PROTEINUR, UROBILINOGEN, NITRITE, LEUKOCYTESUR Sepsis Labs: @LABRCNTIP (procalcitonin:4,lacticidven:4) )No results found for this or any previous visit (from the past 240 hour(s)).   Radiological Exams on Admission: No results found.   Assessment/Plan Principal Problem:   Acute CVA (cerebrovascular accident) [facility]) Active Problems:   Essential hypertension    1. Acute CVA -as explained in the history of presenting illness patient has had MRI of the brain 2D echo carotid Doppler CT angiogram of the head and neck at [facility].  CT angiogram of the head and neck did not show significant multifocal stenosis intracranially for which patient has been transferred to [facility].  I did discuss with on-call neurologist Dr. [facility] who at this time is planning to consult interventional neurology for further intervention for which patient will be kept n.p.o.  Except medication.  Patient was started on Plavix and aspirin and statins at [facility]. 2. Hypertension -we will allow for permissive hypertension continue beta-blockers and as needed IV hydralazine.  Will hold off lisinopril for now. 3. History of prediabetes will recheck hemoglobin A1c.  Given that patient has acute CVA with requiring further intervention will need close monitoring for any further deterioration in inpatient status.   DVT prophylaxis: Lovenox. Code Status: Full code. Family Communication: Discussed with patient. Disposition Plan: Home. Consults called:  Neurology. Admission status: Inpatient.   [facility] MD [facility] Pager [phone].  If 7PM-7AM, please contact night-coverage [email or web address] Password [facility]  06/15/2020, 12:53 AM

## 2020-06-15 NOTE — Consult Note (Signed)
Chief Complaint: Patient was seen in consultation today for stroke.  Referring Physician(s): Dr. Rory Percy  Supervising Physician: Luanne Bras  Patient Status: [facility] - In-pt  History of Present Illness: Andrea Maxwell is a 74 y.o. female with past medical history of HTN, arthirtis who presented to [facility] 06/12/20 after episodes of perioral numbness and decreased responsiveness at home.  Initial imagine on arrival to the hospital showed no acute abnormality, however she did have at least one more similar witnessed episode prompting a CODE STROKE activation.  CTA Head and Neck showed multifocal intacranial stenoses, with proximal basilar hig-grade focal stenosis. She was transferred to [facility] for ongoing neurology work-up. NIR consulted for diagnostic angiogram.  Patient assessed at bedside this AM alongside her daughter.  She reports no further episodes of unresponsiveness.  She has no residual symptoms of her subacute right cerebellar infarct.  She has been NPO today.  She has been maintained on aspirin and Plavix since admission.   Past Medical History:  Diagnosis Date  . Arthritis   . Hypertension     History reviewed. No pertinent surgical history.  Allergies: Patient has no known allergies.  Medications: Prior to Admission medications   Not on File     Family History  Problem Relation Age of Onset  . Hypertension Sister     Social History   Socioeconomic History  . Marital status: Married    Spouse name: Not on file  . Number of children: Not on file  . Years of education: Not on file  . Highest education level: Not on file  Occupational History  . Not on file  Tobacco Use  . Smoking status: Never Smoker  . Smokeless tobacco: Never Used  Substance and Sexual Activity  . Alcohol use: Never  . Drug use: Not on file  . Sexual activity: Not on file  Other Topics Concern  . Not on file  Social History Narrative  . Not on file   Social  Determinants of Health   Financial Resource Strain:   . Difficulty of Paying Living Expenses:   Food Insecurity:   . Worried About Charity fundraiser in the Last Year:   . Arboriculturist in the Last Year:   Transportation Needs:   . Film/video editor (Medical):   Marland Kitchen Lack of Transportation (Non-Medical):   Physical Activity:   . Days of Exercise per Week:   . Minutes of Exercise per Session:   Stress:   . Feeling of Stress :   Social Connections:   . Frequency of Communication with Friends and Family:   . Frequency of Social Gatherings with Friends and Family:   . Attends Religious Services:   . Active Member of Clubs or Organizations:   . Attends Archivist Meetings:   Marland Kitchen Marital Status:      Review of Systems: A 12 point ROS discussed and pertinent positives are indicated in the HPI above.  All other systems are negative.  Review of Systems  Constitutional: Negative for fatigue and fever.  Respiratory: Negative for cough and shortness of breath.   Cardiovascular: Negative for chest pain.  Gastrointestinal: Negative for abdominal pain, nausea and vomiting.  Genitourinary: Negative for dysuria.  Musculoskeletal: Negative for back pain.  Neurological: Positive for numbness (perioral, on admission, none since). Negative for dizziness, facial asymmetry, speech difficulty, weakness, light-headedness and headaches.  Psychiatric/Behavioral: Negative for behavioral problems and confusion.    Vital Signs: BP (!) 154/91 (BP Location:  Left Arm)   Pulse 84   Temp 98.1 F (36.7 C) (Oral)   Resp 18   SpO2 96%   Physical Exam Vitals and nursing note reviewed.  Constitutional:      General: She is not in acute distress.    Appearance: Normal appearance. She is not ill-appearing.  HENT:     Mouth/Throat:     Mouth: Mucous membranes are moist.     Pharynx: Oropharynx is clear.  Cardiovascular:     Rate and Rhythm: Normal rate and regular rhythm.  Pulmonary:      Effort: Pulmonary effort is normal. No respiratory distress.     Breath sounds: Normal breath sounds.  Abdominal:     General: Abdomen is flat.     Palpations: Abdomen is soft.  Musculoskeletal:     Cervical back: Normal range of motion and neck supple.  Skin:    General: Skin is warm and dry.  Neurological:     General: No focal deficit present.     Mental Status: She is alert and oriented to person, place, and time. Mental status is at baseline.  Psychiatric:        Mood and Affect: Mood normal.        Behavior: Behavior normal.        Thought Content: Thought content normal.        Judgment: Judgment normal.      MD Evaluation Airway: WNL Heart: WNL Abdomen: WNL Chest/ Lungs: WNL ASA  Classification: 3 Mallampati/Airway Score: Three   Imaging: No results found.  Labs:  CBC: Recent Labs    06/15/20 0011  WBC 8.9  HGB 13.5  HCT 41.3  PLT 211    COAGS: Recent Labs    06/15/20 0816  INR 1.0    BMP: Recent Labs    06/15/20 0011  NA 140  K 3.5  CL 106  CO2 24  GLUCOSE 113*  BUN 8  CALCIUM 9.2  CREATININE 0.66  GFRNONAA >60  GFRAA >60    LIVER FUNCTION TESTS: Recent Labs    06/15/20 0011  BILITOT 0.8  AST 17  ALT 17  ALKPHOS 80  PROT 7.3  ALBUMIN 3.5    TUMOR MARKERS: No results for input(s): AFPTM, CEA, CA199, CHROMGRNA in the last 8760 hours.  Assessment and Plan: Intra-cranial stenosis, high-grade proximal basilar stenosis Patient admitted to [facility] with episodes of oral numbness and unresponsiveness.  Found to have a subacute cerebellar infarct.  CT Head and Neck also showed high-grade basilar stenosis.  IR consulted for diagnostic angiogram.  Met with patient and daughter at bedside.  Discussed the benefits and risks.  All questions answered.  They are agreeable to proceed.  Patient has been NPO today.   Risks and benefits were discussed with the patient including, but not limited to bleeding, infection, vascular  injury or contrast induced renal failure.  This interventional procedure involves the use of X-rays and because of the nature of the planned procedure, it is possible that we will have prolonged use of X-ray fluoroscopy.  Potential radiation risks to you include (but are not limited to) the following: - A slightly elevated risk for cancer  several years later in life. This risk is typically less than 0.5% percent. This risk is low in comparison to the normal incidence of human cancer, which is 33% for women and 50% for men according to the [employer]. - Radiation induced injury can include skin redness, resembling a rash,  tissue breakdown / ulcers and hair loss (which can be temporary or permanent).   The likelihood of either of these occurring depends on the difficulty of the procedure and whether you are sensitive to radiation due to previous procedures, disease, or genetic conditions.   IF your procedure requires a prolonged use of radiation, you will be notified and given written instructions for further action.  It is your responsibility to monitor the irradiated area for the 2 weeks following the procedure and to notify your physician if you are concerned that you have suffered a radiation induced injury.    All of the patient's questions were answered, patient is agreeable to proceed.  Consent signed and in chart.  Thank you for this interesting consult.  I greatly enjoyed meeting Briona Mangiapane and look forward to participating in their care.  A copy of this report was sent to the requesting provider on this date.  Electronically Signed: Docia Barrier, PA 06/15/2020, 9:51 AM   I spent a total of 40 Minutes    in face to face in clinical consultation, greater than 50% of which was counseling/coordinating care for intra-cranial stenosis.

## 2020-06-15 NOTE — Progress Notes (Signed)
Neurointerventional Radiology Brief Note:  Patient assessed at bedside this afternoon following diagnostic angiogram with Dr. Corliss Skains this AM.   Patient stable, recovering well.  Right wrist TR band in place. No bleeding or oozing.   Discussed findings of cerebral angiogram with patient and her daughter.  Explained the significance of these findings in relation to her progressive symptoms and the potential for her to have recurrent symptoms until the occlusion is treated.  Explained the alternative would be conservative management with Plavix and aspirin.  Patient and daughter to discuss intervention and have been given contact information for our department once they have reached a decision.    Update 1614:  Patient and daughter elect to proceed with intervention.  They will be scheduled for the next available time with anesthesia.   As discussed with Dr. Roda Shutters, continue with plan for loading dose Plavix 300 mg today, then 75 mg daily.  Aspirin 325 mg daily.   Loyce Dys, MS RD PA-C

## 2020-06-16 ENCOUNTER — Encounter (HOSPITAL_COMMUNITY): Payer: Self-pay | Admitting: Interventional Radiology

## 2020-06-16 ENCOUNTER — Other Ambulatory Visit: Payer: Self-pay | Admitting: Student

## 2020-06-16 ENCOUNTER — Other Ambulatory Visit: Payer: Self-pay

## 2020-06-16 ENCOUNTER — Other Ambulatory Visit (HOSPITAL_COMMUNITY): Payer: Self-pay | Admitting: Interventional Radiology

## 2020-06-16 DIAGNOSIS — I6509 Occlusion and stenosis of unspecified vertebral artery: Secondary | ICD-10-CM

## 2020-06-16 NOTE — Progress Notes (Signed)
Ms Briski speaks and understands some English, she put the speaker phone on and her daughter Dionne Bucy assisted as needed. Lorie will be available to interpret for Ms Harl on Tuesday. Consent is on the chart.  Ms Eagleton denies chest pain or shortness of breath or s/s of Covid. Patient will have Covid test tomorrow and is aware that she will need to be in quarantine until Tuesday.

## 2020-06-17 ENCOUNTER — Other Ambulatory Visit (HOSPITAL_COMMUNITY)
Admission: RE | Admit: 2020-06-17 | Discharge: 2020-06-17 | Disposition: A | Discharge status: Home / Self Care | Payer: HRSA Program | Source: Ambulatory Visit | Attending: Interventional Radiology | Admitting: Interventional Radiology

## 2020-06-17 DIAGNOSIS — Z01812 Encounter for preprocedural laboratory examination: Secondary | ICD-10-CM | POA: Diagnosis present

## 2020-06-17 DIAGNOSIS — Z20822 Contact with and (suspected) exposure to covid-19: Secondary | ICD-10-CM | POA: Diagnosis not present

## 2020-06-17 LAB — SARS CORONAVIRUS 2 (TAT 6-24 HRS): SARS Coronavirus 2: NEGATIVE

## 2020-06-19 NOTE — H&P (Addendum)
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  Referring Physician(s): Dr. Isidoro Donning  Supervising Physician: Julieanne Cotton  Patient Status: [name] - Out-pt  History of Present Illness: Andrea Maxwell is a 74 y.o. female History of HTN, Arthritis, presented to OSH with perioral numbness and decreased responsiveness. CT from Code Stroke @ OSH showed multifocal intra cranial stenosis. 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 Patient presents for angiogram with cerebral intervention. Patient reports intermittent perioral numbness each episode lasting approximately 5 minutes with the last episode occurring on June 15, 2020   Past Medical History:  Diagnosis Date  . Arthritis   . Hypertension   . Stroke [facility]) 05/23/2020    Past Surgical History:  Procedure Laterality Date  . NO PAST SURGERIES      Allergies: Patient has no known allergies.  Medications: Prior to Admission medications   Medication Sig Start Date End Date Taking? Authorizing Provider  aspirin EC 325 MG EC tablet Take 1 tablet (325 mg total) by mouth daily. 06/16/20   Rai, Delene Ruffini, MD  atorvastatin (LIPITOR) 80 MG tablet Take 1 tablet (80 mg total) by mouth daily. 06/16/20   Rai, Delene Ruffini, MD  clopidogrel (PLAVIX) 75 MG tablet Take 1 tablet (75 mg total) by mouth daily. 06/16/20 09/14/20  Rai, Ripudeep K, MD  lisinopril (ZESTRIL) 20 MG tablet Take 20 mg by mouth daily. 05/27/20   [provider]  metoprolol tartrate (LOPRESSOR) 25 MG tablet Take 25 mg by mouth 2 (two) times daily. 05/27/20   [provider]  Omega-3 Fatty Acids (FISH OIL PO) Take 1 capsule by mouth in the morning and at bedtime.    [provider]     Family History  Problem Relation Age of Onset  . Hypertension Sister     Social History   Socioeconomic History  . Marital status: Married    Spouse name: Not on file  . Number  of children: Not on file  . Years of education: Not on file  . Highest education level: Not on file  Occupational History  . Not on file  Tobacco Use  . Smoking status: Never Smoker  . Smokeless tobacco: Never Used  Vaping Use  . Vaping Use: Never used  Substance and Sexual Activity  . Alcohol use: Never  . Drug use: Never  . Sexual activity: Not on file  Other Topics Concern  . Not on file  Social History Narrative  . Not on file   Social Determinants of Health   Financial Resource Strain:   . Difficulty of Paying Living Expenses:   Food Insecurity:   . Worried About Programme researcher, broadcasting/film/video in the Last Year:   . Barista in the Last Year:   Transportation Needs:   . Freight forwarder (Medical):   Marland Kitchen Lack of Transportation (Non-Medical):   Physical Activity:   . Days of Exercise per Week:   . Minutes of Exercise per Session:   Stress:   . Feeling of Stress :   Social Connections:   . Frequency of Communication with Friends and Family:   . Frequency of Social Gatherings with Friends and Family:   . Attends Religious Services:   . Active Member of Clubs or Organizations:   . Attends Banker Meetings:   Marland Kitchen Marital Status:     Review of Systems: A 12 point ROS discussed and pertinent positives are indicated  in the HPI above.  All other systems are negative.  Review of Systems  Constitutional: Negative for fatigue and fever.  HENT: Negative for congestion.   Respiratory: Negative for cough and shortness of breath.   Gastrointestinal: Negative for abdominal pain, diarrhea, nausea and vomiting.    Vital Signs: There were no vitals taken for this visit.  Physical Exam Vitals and nursing note reviewed.  Constitutional:      Appearance: She is well-developed.  HENT:     Head: Normocephalic and atraumatic.  Eyes:     Conjunctiva/sclera: Conjunctivae normal.  Cardiovascular:     Rate and Rhythm: Normal rate and regular rhythm.     Heart sounds:  Normal heart sounds.  Pulmonary:     Effort: Pulmonary effort is normal.     Breath sounds: Normal breath sounds.  Musculoskeletal:        General: Normal range of motion.     Cervical back: Normal range of motion.  Skin:    General: Skin is warm.  Neurological:     Mental Status: She is alert and oriented to person, place, and time.     Comments: Alert, aware and oriented X 3 Speech and comprehension is intact.  PERRL bilaterally No facial droop noted Tongue midline Can spontaneously move all 4 extremities. Hand grip strength equal bilaterally. Right dorsiflexion 5/5, left dorsiflexion 5/5. Plantar flection 5/5 bilaterally.  Fine motor and coordination slow but intact.  Distal pulses (DP's) palpable bilaterally  Negativepronator drift. Fine motor and coordinationgrossly in tact Gaitnot assessed Rombergnot assessed Heel to toenot assessed Distal pulsesnot assessed      Imaging: No results found.  Labs:  CBC: Recent Labs    06/15/20 0011  WBC 8.9  HGB 13.5  HCT 41.3  PLT 211    COAGS: Recent Labs    06/15/20 0816  INR 1.0    BMP: Recent Labs    06/15/20 0011  NA 140  K 3.5  CL 106  CO2 24  GLUCOSE 113*  BUN 8  CALCIUM 9.2  CREATININE 0.66  GFRNONAA >60  GFRAA >60    LIVER FUNCTION TESTS: Recent Labs    06/15/20 0011  BILITOT 0.8  AST 17  ALT 17  ALKPHOS 80  PROT 7.3  ALBUMIN 3.5    Assessment and Plan:  74 y.o, female outpatient. History of HTN, Arthritis, presented to OSH with perioral numbness and decreased responsiveness. CT from Code Stroke @ OSH showed multifocal intra cranial stenosis. 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 Patient presents for angiogram with cerebral intervention. 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   Pertinent Allergies NKDA   All labs are within acceptable parameters. Patient is afebrile. Patient is on 325 mg of ASA and 75 mg of Plavix.  Risks and benefits of proximal basilar artery arteriogram with intervention were discussed with the patient including, but not limited to bleeding, infection, vascular injury, contrast induced renal failure, stroke, reperfusion hemorrhage, or even death. This interventional procedure involves the use of X-rays and because of the nature of the planned procedure, it is possible that we will have prolonged use of X-ray fluoroscopy. Potential radiation risks to you include (but are not limited to) the following: - A slightly  elevated risk for cancer  several years later in life. This risk is typically less than 0.5% percent. This risk is low in comparison to the normal incidence of human cancer, which is 33% for women and 50% for men according to the [employer]. - Radiation induced injury can include skin redness, resembling a rash, tissue breakdown / ulcers and hair loss (which can be temporary or permanent).  The likelihood of either of these occurring depends on the difficulty of the procedure and whether you are sensitive to radiation due to previous procedures, disease, or genetic conditions.  IF your procedure requires a prolonged use of radiation, you will be notified and given written instructions for further action.  It is your responsibility to monitor the irradiated area for the 2 weeks following the procedure and to notify your physician if you are concerned that you have suffered a radiation induced injury.   All of the patient's questions were answered, patient is agreeable to proceed. Consent signed and in chart.    Thank you for this interesting consult.  I greatly enjoyed meeting Macall Minotti and look  forward to participating in their care.  A copy of this report was sent to the requesting provider on this date.  Electronically Signed: Marletta Lor, NP 06/19/2020, 8:20 PM   I spent a total of 40 Minutes    in face to face in clinical consultation, greater than 50% of which was counseling/coordinating care for intervention of  basilar artery stenosis

## 2020-06-19 NOTE — Anesthesia Preprocedure Evaluation (Addendum)
Anesthesia Evaluation  Patient identified by MRN, date of birth, ID band Patient awake    Reviewed: Allergy & Precautions, NPO status , Patient's Chart, lab work & pertinent test results  Airway Mallampati: III  TM Distance: >3 FB Neck ROM: Full    Dental  (+) Upper Dentures, Lower Dentures   Pulmonary neg pulmonary ROS,    Pulmonary exam normal breath sounds clear to auscultation       Cardiovascular hypertension, Pt. on medications and Pt. on home beta blockers Normal cardiovascular exam Rhythm:Regular Rate:Normal     Neuro/Psych CVA negative psych ROS   GI/Hepatic negative GI ROS, Neg liver ROS,   Endo/Other  negative endocrine ROS  Renal/GU negative Renal ROS     Musculoskeletal negative musculoskeletal ROS (+)   Abdominal   Peds  Hematology negative hematology ROS (+)   Anesthesia Other Findings Stenosis of artery   Reproductive/Obstetrics                            Anesthesia Physical Anesthesia Plan  ASA: III  Anesthesia Plan: General   Post-op Pain Management:    Induction: Intravenous  PONV Risk Score and Plan: 3 and Dexamethasone, Ondansetron and Treatment may vary due to age or medical condition  Airway Management Planned: Oral ETT  Additional Equipment: Arterial line  Intra-op Plan:   Post-operative Plan: Extubation in OR  Informed Consent: I have reviewed the patients History and Physical, chart, labs and discussed the procedure including the risks, benefits and alternatives for the proposed anesthesia with the patient or authorized representative who has indicated his/her understanding and acceptance.       Plan Discussed with: CRNA  Anesthesia Plan Comments:       Anesthesia Quick Evaluation

## 2020-06-20 ENCOUNTER — Inpatient Hospital Stay (HOSPITAL_COMMUNITY): Payer: Self-pay | Admitting: Certified Registered Nurse Anesthetist

## 2020-06-20 ENCOUNTER — Ambulatory Visit (HOSPITAL_COMMUNITY)
Admission: RE | Admit: 2020-06-20 | Discharge: 2020-06-20 | Disposition: A | Discharge status: Home / Self Care | Payer: Self-pay | Source: Ambulatory Visit | Attending: Interventional Radiology | Admitting: Interventional Radiology

## 2020-06-20 ENCOUNTER — Other Ambulatory Visit: Payer: Self-pay

## 2020-06-20 ENCOUNTER — Encounter (HOSPITAL_COMMUNITY): Payer: Self-pay | Admitting: Interventional Radiology

## 2020-06-20 ENCOUNTER — Encounter (HOSPITAL_COMMUNITY)
Admission: RE | Disposition: A | Discharge status: Home / Self Care | Payer: Self-pay | Source: Home / Self Care | Attending: Interventional Radiology

## 2020-06-20 ENCOUNTER — Observation Stay (HOSPITAL_COMMUNITY)
Admission: RE | Admit: 2020-06-20 | Discharge: 2020-06-21 | Disposition: A | Discharge status: Home / Self Care | Payer: Self-pay | Attending: Interventional Radiology | Admitting: Interventional Radiology

## 2020-06-20 DIAGNOSIS — Z79899 Other long term (current) drug therapy: Secondary | ICD-10-CM | POA: Insufficient documentation

## 2020-06-20 DIAGNOSIS — I10 Essential (primary) hypertension: Secondary | ICD-10-CM | POA: Insufficient documentation

## 2020-06-20 DIAGNOSIS — R7303 Prediabetes: Secondary | ICD-10-CM | POA: Insufficient documentation

## 2020-06-20 DIAGNOSIS — Z7902 Long term (current) use of antithrombotics/antiplatelets: Secondary | ICD-10-CM | POA: Insufficient documentation

## 2020-06-20 DIAGNOSIS — R2689 Other abnormalities of gait and mobility: Secondary | ICD-10-CM | POA: Insufficient documentation

## 2020-06-20 DIAGNOSIS — Z7982 Long term (current) use of aspirin: Secondary | ICD-10-CM | POA: Insufficient documentation

## 2020-06-20 DIAGNOSIS — Z8673 Personal history of transient ischemic attack (TIA), and cerebral infarction without residual deficits: Secondary | ICD-10-CM | POA: Insufficient documentation

## 2020-06-20 DIAGNOSIS — I6509 Occlusion and stenosis of unspecified vertebral artery: Secondary | ICD-10-CM

## 2020-06-20 DIAGNOSIS — I651 Occlusion and stenosis of basilar artery: Principal | ICD-10-CM | POA: Diagnosis present

## 2020-06-20 DIAGNOSIS — R29898 Other symptoms and signs involving the musculoskeletal system: Secondary | ICD-10-CM | POA: Insufficient documentation

## 2020-06-20 HISTORY — PX: IR INTRA CRAN STENT: IMG2345

## 2020-06-20 HISTORY — PX: IR US GUIDE VASC ACCESS RIGHT: IMG2390

## 2020-06-20 HISTORY — PX: RADIOLOGY WITH ANESTHESIA: SHX6223

## 2020-06-20 LAB — BASIC METABOLIC PANEL
Anion gap: 10 (ref 5–15)
BUN: 12 mg/dL (ref 8–23)
CO2: 23 mmol/L (ref 22–32)
Calcium: 9.2 mg/dL (ref 8.9–10.3)
Chloride: 106 mmol/L (ref 98–111)
Creatinine, Ser: 0.77 mg/dL (ref 0.44–1.00)
GFR calc Af Amer: >60 mL/min (ref >60)
GFR calc non Af Amer: >60 mL/min (ref >60)
Glucose, Bld: 108 mg/dL — ABNORMAL HIGH (ref 70–99)
Potassium: 4.2 mmol/L (ref 3.5–5.1)
Sodium: 139 mmol/L (ref 135–145)

## 2020-06-20 LAB — POCT ACTIVATED CLOTTING TIME
Activated Clotting Time: 213 seconds
Activated Clotting Time: 235 seconds
Activated Clotting Time: 257 seconds

## 2020-06-20 LAB — CBC WITH DIFFERENTIAL/PLATELET
Abs Immature Granulocytes: 0.03 10*3/uL (ref 0.00–0.07)
Basophils Absolute: 0.1 10*3/uL (ref 0.0–0.1)
Basophils Relative: 1 %
Eosinophils Absolute: 0.3 10*3/uL (ref 0.0–0.5)
Eosinophils Relative: 4 %
HCT: 41 % (ref 36.0–46.0)
Hemoglobin: 13.3 g/dL (ref 12.0–15.0)
Immature Granulocytes: 0 %
Lymphocytes Relative: 32 %
Lymphs Abs: 2.7 10*3/uL (ref 0.7–4.0)
MCH: 30.5 pg (ref 26.0–34.0)
MCHC: 32.4 g/dL (ref 30.0–36.0)
MCV: 94 fL (ref 80.0–100.0)
Monocytes Absolute: 0.5 10*3/uL (ref 0.1–1.0)
Monocytes Relative: 6 %
Neutro Abs: 4.8 10*3/uL (ref 1.7–7.7)
Neutrophils Relative %: 57 %
Platelets: 218 10*3/uL (ref 150–400)
RBC: 4.36 MIL/uL (ref 3.87–5.11)
RDW: 12.1 % (ref 11.5–15.5)
WBC: 8.5 10*3/uL (ref 4.0–10.5)
nRBC: 0 % (ref 0.0–0.2)

## 2020-06-20 LAB — MRSA PCR SCREENING: MRSA by PCR: NEGATIVE

## 2020-06-20 LAB — HEPARIN LEVEL (UNFRACTIONATED): Heparin Unfractionated: 0.24 IU/mL — ABNORMAL LOW (ref 0.30–0.70)

## 2020-06-20 LAB — PROTIME-INR
INR: 1 (ref 0.8–1.2)
Prothrombin Time: 12.8 seconds (ref 11.4–15.2)

## 2020-06-20 LAB — APTT: aPTT: 24 seconds (ref 24–36)

## 2020-06-20 SURGERY — IR WITH ANESTHESIA
Anesthesia: General

## 2020-06-20 MED ORDER — CEFAZOLIN SODIUM-DEXTROSE 2-4 GM/100ML-% IV SOLN
2.0000 g | INTRAVENOUS | Status: AC
  Administered 2020-06-20: 2 g via INTRAVENOUS
  Filled 2020-06-20: qty 100

## 2020-06-20 MED ORDER — EPTIFIBATIDE 20 MG/10ML IV SOLN
INTRAVENOUS | Status: AC
  Filled 2020-06-20: qty 10

## 2020-06-20 MED ORDER — HEPARIN (PORCINE) 25000 UT/250ML-% IV SOLN: 500.0000 [IU]/h | INTRAVENOUS | Status: DC

## 2020-06-20 MED ORDER — HEPARIN SODIUM (PORCINE) 1000 UNIT/ML IJ SOLN
INTRAMUSCULAR | Status: AC
  Filled 2020-06-20: qty 1

## 2020-06-20 MED ORDER — ACETAMINOPHEN 500 MG PO TABS: 1000.0000 mg | ORAL_TABLET | Freq: Once | ORAL | Status: DC

## 2020-06-20 MED ORDER — DEXAMETHASONE SODIUM PHOSPHATE 10 MG/ML IJ SOLN
INTRAMUSCULAR | Status: DC | PRN
  Administered 2020-06-20: 10 mg via INTRAVENOUS

## 2020-06-20 MED ORDER — PHENYLEPHRINE HCL-NACL 10-0.9 MG/250ML-% IV SOLN
INTRAVENOUS | Status: DC | PRN
  Administered 2020-06-20: 20 ug/min via INTRAVENOUS

## 2020-06-20 MED ORDER — EPTIFIBATIDE 20 MG/10ML IV SOLN
INTRAVENOUS | Status: AC | PRN
  Administered 2020-06-20 (×4): 1.5 mg via INTRAVENOUS

## 2020-06-20 MED ORDER — IOHEXOL 300 MG/ML  SOLN
150.0000 mL | Freq: Once | INTRAMUSCULAR | Status: AC | PRN
  Administered 2020-06-20: 60 mL via INTRA_ARTERIAL

## 2020-06-20 MED ORDER — ONDANSETRON HCL 4 MG/2ML IJ SOLN
INTRAMUSCULAR | Status: DC | PRN
  Administered 2020-06-20 (×2): 4 mg via INTRAVENOUS

## 2020-06-20 MED ORDER — LIDOCAINE HCL 1 % IJ SOLN
INTRAMUSCULAR | Status: AC
  Filled 2020-06-20: qty 20

## 2020-06-20 MED ORDER — NITROGLYCERIN 1 MG/10 ML FOR IR/CATH LAB
INTRA_ARTERIAL | Status: AC | PRN
  Administered 2020-06-20: 200 ug via INTRA_ARTERIAL

## 2020-06-20 MED ORDER — CLEVIDIPINE BUTYRATE 0.5 MG/ML IV EMUL
0.0000 mg/h | INTRAVENOUS | Status: DC
  Administered 2020-06-20: 1 mg/h via INTRAVENOUS
  Administered 2020-06-20 – 2020-06-21 (×5): 6 mg/h via INTRAVENOUS
  Filled 2020-06-20 (×5): qty 50

## 2020-06-20 MED ORDER — ROCURONIUM BROMIDE 10 MG/ML (PF) SYRINGE
PREFILLED_SYRINGE | INTRAVENOUS | Status: DC | PRN
  Administered 2020-06-20: 20 mg via INTRAVENOUS
  Administered 2020-06-20: 60 mg via INTRAVENOUS

## 2020-06-20 MED ORDER — FENTANYL CITRATE (PF) 100 MCG/2ML IJ SOLN
INTRAMUSCULAR | Status: AC
  Filled 2020-06-20: qty 2

## 2020-06-20 MED ORDER — HEPARIN (PORCINE) 25000 UT/250ML-% IV SOLN
500.0000 [IU]/h | INTRAVENOUS | Status: DC
  Administered 2020-06-20: 500 [IU]/h via INTRAVENOUS

## 2020-06-20 MED ORDER — CLOPIDOGREL BISULFATE 75 MG PO TABS: 75.0000 mg | ORAL_TABLET | Freq: Every day | ORAL | Status: DC

## 2020-06-20 MED ORDER — ASPIRIN 325 MG PO TABS
325.0000 mg | ORAL_TABLET | Freq: Every day | ORAL | Status: DC
  Administered 2020-06-21: 325 mg via ORAL
  Filled 2020-06-20: qty 1

## 2020-06-20 MED ORDER — EPHEDRINE SULFATE-NACL 50-0.9 MG/10ML-% IV SOSY
PREFILLED_SYRINGE | INTRAVENOUS | Status: DC | PRN
  Administered 2020-06-20 (×2): 10 mg via INTRAVENOUS
  Administered 2020-06-20: 5 mg via INTRAVENOUS
  Administered 2020-06-20: 10 mg via INTRAVENOUS
  Administered 2020-06-20: 5 mg via INTRAVENOUS

## 2020-06-20 MED ORDER — PROTAMINE SULFATE 10 MG/ML IV SOLN
INTRAVENOUS | Status: DC | PRN
Start: 2020-06-20 — End: 2020-06-20
  Administered 2020-06-20 (×2): 5 mg via INTRAVENOUS

## 2020-06-20 MED ORDER — SODIUM CHLORIDE 0.9 % IV SOLN: INTRAVENOUS | Status: DC

## 2020-06-20 MED ORDER — ACETAMINOPHEN 325 MG PO TABS
650.0000 mg | ORAL_TABLET | ORAL | Status: DC | PRN
  Administered 2020-06-20: 650 mg via ORAL
  Filled 2020-06-20: qty 2

## 2020-06-20 MED ORDER — LACTATED RINGERS IV SOLN: INTRAVENOUS | Status: DC | PRN

## 2020-06-20 MED ORDER — ASPIRIN 81 MG PO CHEW: 324.0000 mg | CHEWABLE_TABLET | Freq: Every day | ORAL | Status: DC

## 2020-06-20 MED ORDER — HEPARIN SODIUM (PORCINE) 1000 UNIT/ML IJ SOLN
INTRAMUSCULAR | Status: DC | PRN
  Administered 2020-06-20: 1000 [IU] via INTRAVENOUS

## 2020-06-20 MED ORDER — METOPROLOL TARTRATE 25 MG PO TABS
25.0000 mg | ORAL_TABLET | Freq: Two times a day (BID) | ORAL | Status: DC
  Administered 2020-06-21: 25 mg via ORAL
  Filled 2020-06-20: qty 1

## 2020-06-20 MED ORDER — IOHEXOL 300 MG/ML  SOLN
150.0000 mL | Freq: Once | INTRAMUSCULAR | Status: AC | PRN
  Administered 2020-06-20: 65 mL via INTRA_ARTERIAL

## 2020-06-20 MED ORDER — NITROGLYCERIN 1 MG/10 ML FOR IR/CATH LAB
INTRA_ARTERIAL | Status: AC
  Filled 2020-06-20: qty 10

## 2020-06-20 MED ORDER — PHENYLEPHRINE 40 MCG/ML (10ML) SYRINGE FOR IV PUSH (FOR BLOOD PRESSURE SUPPORT): PREFILLED_SYRINGE | INTRAVENOUS | Status: DC | PRN

## 2020-06-20 MED ORDER — LIDOCAINE 2% (20 MG/ML) 5 ML SYRINGE
INTRAMUSCULAR | Status: DC | PRN
  Administered 2020-06-20: 60 mg via INTRAVENOUS

## 2020-06-20 MED ORDER — VERAPAMIL HCL 2.5 MG/ML IV SOLN
INTRAVENOUS | Status: AC
  Filled 2020-06-20: qty 2

## 2020-06-20 MED ORDER — ATORVASTATIN CALCIUM 80 MG PO TABS
80.0000 mg | ORAL_TABLET | Freq: Every day | ORAL | Status: DC
  Administered 2020-06-21: 80 mg via ORAL
  Filled 2020-06-20: qty 1

## 2020-06-20 MED ORDER — CLEVIDIPINE BUTYRATE 0.5 MG/ML IV EMUL
INTRAVENOUS | Status: AC
  Filled 2020-06-20: qty 50

## 2020-06-20 MED ORDER — ACETAMINOPHEN 160 MG/5ML PO SOLN: 650.0000 mg | ORAL | Status: DC | PRN

## 2020-06-20 MED ORDER — CLOPIDOGREL BISULFATE 75 MG PO TABS
75.0000 mg | ORAL_TABLET | Freq: Every day | ORAL | Status: DC
  Administered 2020-06-21: 75 mg via ORAL
  Filled 2020-06-20: qty 1

## 2020-06-20 MED ORDER — VERAPAMIL HCL 2.5 MG/ML IV SOLN: INTRA_ARTERIAL | Status: AC | PRN

## 2020-06-20 MED ORDER — SUGAMMADEX SODIUM 200 MG/2ML IV SOLN
INTRAVENOUS | Status: DC | PRN
  Administered 2020-06-20: 200 mg via INTRAVENOUS

## 2020-06-20 MED ORDER — CLOPIDOGREL BISULFATE 75 MG PO TABS
75.0000 mg | ORAL_TABLET | ORAL | Status: AC
  Administered 2020-06-20: 75 mg via ORAL
  Filled 2020-06-20: qty 1

## 2020-06-20 MED ORDER — NIMODIPINE 30 MG PO CAPS
0.0000 mg | ORAL_CAPSULE | ORAL | Status: AC
  Administered 2020-06-20: 60 mg via ORAL
  Filled 2020-06-20: qty 2

## 2020-06-20 MED ORDER — ONDANSETRON HCL 4 MG/2ML IJ SOLN: 4.0000 mg | Freq: Four times a day (QID) | INTRAMUSCULAR | Status: DC | PRN

## 2020-06-20 MED ORDER — CLEVIDIPINE BUTYRATE 0.5 MG/ML IV EMUL
INTRAVENOUS | Status: DC | PRN
  Administered 2020-06-20: 1 mg/h via INTRAVENOUS

## 2020-06-20 MED ORDER — ACETAMINOPHEN 650 MG RE SUPP: 650.0000 mg | RECTAL | Status: DC | PRN

## 2020-06-20 MED ORDER — LIDOCAINE HCL (PF) 1 % IJ SOLN
INTRAMUSCULAR | Status: AC | PRN
  Administered 2020-06-20: 5 mL

## 2020-06-20 MED ORDER — PROPOFOL 10 MG/ML IV BOLUS
INTRAVENOUS | Status: DC | PRN
  Administered 2020-06-20: 20 mg via INTRAVENOUS
  Administered 2020-06-20: 100 mg via INTRAVENOUS
  Administered 2020-06-20: 40 mg via INTRAVENOUS

## 2020-06-20 MED ORDER — HEPARIN SODIUM (PORCINE) 1000 UNIT/ML IJ SOLN
INTRAMUSCULAR | Status: AC | PRN
  Administered 2020-06-20: 2000 [IU] via INTRA_ARTERIAL

## 2020-06-20 MED ORDER — FENTANYL CITRATE (PF) 100 MCG/2ML IJ SOLN
INTRAMUSCULAR | Status: DC | PRN
  Administered 2020-06-20 (×3): 25 ug via INTRAVENOUS
  Administered 2020-06-20: 50 ug via INTRAVENOUS
  Administered 2020-06-20: 25 ug via INTRAVENOUS
  Administered 2020-06-20: 50 ug via INTRAVENOUS

## 2020-06-20 MED ORDER — LISINOPRIL 20 MG PO TABS
20.0000 mg | ORAL_TABLET | Freq: Every day | ORAL | Status: DC
  Administered 2020-06-21: 20 mg via ORAL
  Filled 2020-06-20: qty 1

## 2020-06-20 MED ORDER — ASPIRIN EC 325 MG PO TBEC: 325.0000 mg | DELAYED_RELEASE_TABLET | ORAL | Status: DC

## 2020-06-20 MED ORDER — PHENYLEPHRINE 40 MCG/ML (10ML) SYRINGE FOR IV PUSH (FOR BLOOD PRESSURE SUPPORT)
PREFILLED_SYRINGE | INTRAVENOUS | Status: DC | PRN
  Administered 2020-06-20 (×2): 80 ug via INTRAVENOUS
  Administered 2020-06-20: 40 ug via INTRAVENOUS
  Administered 2020-06-20 (×4): 80 ug via INTRAVENOUS

## 2020-06-20 MED ORDER — HEPARIN (PORCINE) 25000 UT/250ML-% IV SOLN
INTRAVENOUS | Status: AC
  Filled 2020-06-20: qty 250

## 2020-06-20 MED ORDER — CHLORHEXIDINE GLUCONATE CLOTH 2 % EX PADS: 6.0000 | MEDICATED_PAD | Freq: Every day | CUTANEOUS | Status: DC

## 2020-06-20 NOTE — Procedures (Signed)
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  Given 6 mg of integrelin IA during the procedure and 10 mg of protamine sulfate. Extubated.Slow recovery from GA. Opens eyes to command and moves all 4s proprtional to effort. Pupils 63mm Rt = LT  No facial asymmetry.  Tongue midline. RT radial  pulse present. RT groin soft. Distal pulses all dopplerable,unchanged. S.Jamiah Recore MD

## 2020-06-20 NOTE — Progress Notes (Signed)
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 Pharmacy consulted to dose heparin per post-interventional neuroradiology procedure. Heparin started at 500 units/hr post-op. CBC wnl. No active bleed issues reported. Patient is not on anticoagulation PTA.  Goal of Therapy:  Heparin level 0.1-0.25 units/ml Monitor platelets by anticoagulation protocol: Yes   Plan:  No bolus. Continue heparin at 500 units/hr Check 8hr heparin level Monitor CBC, s/sx bleeding Heparin off 7/7 at 0800 per protcol  Babs Bertin, PharmD, BCPS Clinical Pharmacist 06/20/2020 3:17 PM

## 2020-06-20 NOTE — Sedation Documentation (Addendum)
[id number]

## 2020-06-20 NOTE — Sedation Documentation (Signed)
Bedside report given to [facility] PACU. Groin site assessed, soft to palpation with no hematoma noted. TR band assessed via right with palpable pulse. No bleeding noted at the site. Neuro exam performed, patient moving all extremities purposely, intact grip strength bilaterally. No arm or leg drift. Patient following basic commands with obvious language barrier.

## 2020-06-20 NOTE — Progress Notes (Signed)
ANTICOAGULATION CONSULT NOTE  Pharmacy Consult for heparin Indication: post-interventional neuroradiology procedure  Heparin Dosing Weight: 59 kg  Labs: Recent Labs    06/20/20 0721 06/20/20 2310  HGB 13.3  --   HCT 41.0  --   PLT 218  --   APTT 24  --   LABPROT 12.8  --   INR 1.0  --   HEPARINUNFRC  --  0.24*  CREATININE 0.77  --     Assessment: 74 yo female s/p stent-assisted angioplasty of basilar artery for heparin.  Goal of Therapy:  Heparin level 0.1-0.25 units/ml Monitor platelets by anticoagulation protocol: Yes   Plan:  Continue Heparin at current rate   Geannie Risen, PharmD, BCPS  06/20/2020 11:48 PM

## 2020-06-20 NOTE — Sedation Documentation (Signed)
ACT 257 °

## 2020-06-20 NOTE — Transfer of Care (Signed)
Immediate Anesthesia Transfer of Care Note  Patient: Andrea Maxwell  Procedure(s) Performed: cerebral angioplasty with stenting (N/A )  Patient Location: PACU  Anesthesia Type:General  Level of Consciousness: awake, alert  and oriented  Airway & Oxygen Therapy: Patient Spontanous Breathing and Patient connected to face mask oxygen  Post-op Assessment: Report given to RN and Post -op Vital signs reviewed and stable  Post vital signs: Reviewed and stable  Last Vitals:  Vitals Value Taken Time  BP 112/61 06/20/20 1340  Temp    Pulse 85 06/20/20 1344  Resp 17 06/20/20 1344  SpO2 100 % 06/20/20 1344  Vitals shown include unvalidated device data.  Last Pain:  Vitals:   06/20/20 0734  TempSrc: Oral  PainSc: 0-No pain         Complications: No complications documented.

## 2020-06-20 NOTE — Sedation Documentation (Signed)
ACT - 235

## 2020-06-20 NOTE — Anesthesia Procedure Notes (Signed)
Procedure Name: Intubation Date/Time: 06/20/2020 9:43 AM Performed by: Pearson Grippe, CRNA Pre-anesthesia Checklist: Patient identified, Emergency Drugs available, Suction available and Patient being monitored Patient Re-evaluated:Patient Re-evaluated prior to induction Oxygen Delivery Method: Circle system utilized Preoxygenation: Pre-oxygenation with 100% oxygen Induction Type: IV induction Ventilation: Mask ventilation without difficulty Laryngoscope Size: Miller and 2 Grade View: Grade I Tube type: Oral Tube size: 7.0 mm Number of attempts: 1 Airway Equipment and Method: Stylet Placement Confirmation: ETT inserted through vocal cords under direct vision,  positive ETCO2 and breath sounds checked- equal and bilateral Secured at: 20 cm Tube secured with: Tape Dental Injury: Teeth and Oropharynx as per pre-operative assessment

## 2020-06-20 NOTE — Sedation Documentation (Signed)
Timeout Continued- Image guided cerebral arteriogram with possible revascularization/angioplasty/stent placement of basilar artery stenosis

## 2020-06-20 NOTE — Anesthesia Procedure Notes (Signed)
Arterial Line Insertion Start/End7/05/2020 9:05 AM, 06/20/2020 9:10 AM Performed by: Leonides Grills, MD, Pearson Grippe, CRNA, CRNA  Patient location: Pre-op. Preanesthetic checklist: patient identified, IV checked, site marked, risks and benefits discussed, surgical consent, monitors and equipment checked, pre-op evaluation, timeout performed and anesthesia consent Lidocaine 1% used for infiltration Left, radial was placed Catheter size: 20 Fr Hand hygiene performed  and maximum sterile barriers used   Attempts: 1 Procedure performed without using ultrasound guided technique. Following insertion, dressing applied. Post procedure assessment: normal and unchanged  Patient tolerated the procedure well with no immediate complications.

## 2020-06-20 NOTE — Progress Notes (Signed)
Patient ID: Andrea Maxwell, female   DOB: 06-Sep-1946, 73 y.o.   MRN: [id number] INR. Patient referred for  for revascularization of symptomatic severe prox basilar artery stenosis by  stroke neurology. Patient seen and examined. Clinically asymptomatic. Denies any H/A,N/V  Or visual ,motor or coordination difficulties over the past 4 days. O/E  Alert awake Ox 3 . Non focal . Patient and daughter understand the procedure,reasons and alternatives. alternatives.  Risks of ischemic stroke ,3 to 5 % with worsening neuro function,requiring breathing assistance on a vent ,loss of motor function , feeding difficulties and loss of independent function,intracranial hemorrhage and death,discussed with daughter and patient both of whom expressed understanding and agreed to proceed with the treatment. Informed consent obtained from the patient. .  S.Comer Devins MD

## 2020-06-20 NOTE — Progress Notes (Signed)
NIR Brief Rounding Note:  History of proximal basilar artery stenosis s/p revascularization of proximal basilar artery stenosis using stent assisted angioplasty via right radial and right femoral this AM by Dr. Corliss Skains.  Went to evaluate patient bedside alongside Dr. Corliss Skains. Patient awake and alert laying in bed. Accompanied by daughter at bedside who acted as Nurse, learning disability for patient. Patient complaining of pain at right wrist. Denies headache, weakness, or vision changes.  Alert, awake, and oriented x3. Speech and comprehension intact. PERRL bilaterally. EOMs intact bilaterally without nystagmus or subjective diplopia. No facial asymmetry. Tongue midline. Motor power symmetric proportional to effort. No pronator drift. Fine motor and coordination intact and symmetric. Distal pulses- radial 2+ bilaterally; DPs 1+ bilaterally. Right femoral puncture site soft without active bleeding or hematoma. Right radial puncture site with minimal oozing under TR band, with palpable tenderness/firmness proximal to puncture site (forearm 10 in on right and 9 in on left).  Plan to stay in neuro ICU for overnight observation. 5 cc air added to TR band- begin removal in 1 hour. Firmness marked- monitor for changes. Stop Heparin x 4 hours, please page Dr. Corliss Skains prior to restarting heparin. Advance diet as tolerated. NIR to follow.   Andrea Boga Aziz Slape, PA-C 06/20/2020, 4:52 PM

## 2020-06-20 NOTE — Progress Notes (Signed)
Bleeding noted at 1620 at R radial site, air placed back in TR band by Dr Corliss Skains at [facility]. Hematoma marked around R radial site, arm elevated, pulses +2. At 1730 oozing noted on gauze at R femoral site, DP pulses +2. Pressure held for 15 mins, site stable. Pt returned to full flat from reverse trendelenberg.  Per Dr Corliss Skains Heparin off at 1700 and to remain off until 2100, requested to be paged at that time for an update.

## 2020-06-20 NOTE — Anesthesia Postprocedure Evaluation (Signed)
Anesthesia Post Note  Patient: Andrea Maxwell  Procedure(s) Performed: cerebral angioplasty with stenting (N/A )     Patient location during evaluation: PACU Anesthesia Type: General Level of consciousness: awake Pain management: pain level controlled Vital Signs Assessment: post-procedure vital signs reviewed and stable Respiratory status: spontaneous breathing, nonlabored ventilation, respiratory function stable and patient connected to nasal cannula oxygen Cardiovascular status: blood pressure returned to baseline and stable Postop Assessment: no apparent nausea or vomiting Anesthetic complications: no   No complications documented.  Last Vitals:  Vitals:   06/20/20 1500 06/20/20 1505  BP: (!) 114/59   Pulse: 85   Resp:    Temp: 36.6 C   SpO2: 100% 100%    Last Pain:  Vitals:   06/20/20 1500  TempSrc: Oral  PainSc:                  Raad Clayson P Sigmund Morera

## 2020-06-21 ENCOUNTER — Encounter (HOSPITAL_COMMUNITY): Payer: Self-pay

## 2020-06-21 HISTORY — PX: IR ANGIO VERTEBRAL SEL VERTEBRAL UNI R MOD SED: IMG5368

## 2020-06-21 LAB — CBC WITH DIFFERENTIAL/PLATELET
Abs Immature Granulocytes: 0.05 10*3/uL (ref 0.00–0.07)
Basophils Absolute: 0 10*3/uL (ref 0.0–0.1)
Basophils Relative: 0 %
Eosinophils Absolute: 0 10*3/uL (ref 0.0–0.5)
Eosinophils Relative: 0 %
HCT: 33.9 % — ABNORMAL LOW (ref 36.0–46.0)
Hemoglobin: 11.5 g/dL — ABNORMAL LOW (ref 12.0–15.0)
Immature Granulocytes: 0 %
Lymphocytes Relative: 18 %
Lymphs Abs: 2 10*3/uL (ref 0.7–4.0)
MCH: 31.3 pg (ref 26.0–34.0)
MCHC: 33.9 g/dL (ref 30.0–36.0)
MCV: 92.1 fL (ref 80.0–100.0)
Monocytes Absolute: 0.7 10*3/uL (ref 0.1–1.0)
Monocytes Relative: 6 %
Neutro Abs: 8.5 10*3/uL — ABNORMAL HIGH (ref 1.7–7.7)
Neutrophils Relative %: 76 %
Platelets: 208 10*3/uL (ref 150–400)
RBC: 3.68 MIL/uL — ABNORMAL LOW (ref 3.87–5.11)
RDW: 12 % (ref 11.5–15.5)
WBC: 11.3 10*3/uL — ABNORMAL HIGH (ref 4.0–10.5)
nRBC: 0 % (ref 0.0–0.2)

## 2020-06-21 LAB — BASIC METABOLIC PANEL
Anion gap: 10 (ref 5–15)
BUN: 9 mg/dL (ref 8–23)
CO2: 20 mmol/L — ABNORMAL LOW (ref 22–32)
Calcium: 8.5 mg/dL — ABNORMAL LOW (ref 8.9–10.3)
Chloride: 111 mmol/L (ref 98–111)
Creatinine, Ser: 0.6 mg/dL (ref 0.44–1.00)
GFR calc Af Amer: >60 mL/min (ref >60)
GFR calc non Af Amer: >60 mL/min (ref >60)
Glucose, Bld: 127 mg/dL — ABNORMAL HIGH (ref 70–99)
Potassium: 3.3 mmol/L — ABNORMAL LOW (ref 3.5–5.1)
Sodium: 141 mmol/L (ref 135–145)

## 2020-06-21 LAB — TRIGLYCERIDES: Triglycerides: 108 mg/dL (ref <150)

## 2020-06-21 LAB — PLATELET INHIBITION P2Y12: Platelet Function  P2Y12: 180 [PRU] — ABNORMAL LOW (ref 182–335)

## 2020-06-21 NOTE — TOC Transition Note (Signed)
Transition of Care [facility]) - CM/SW Discharge Note   Patient Details  Name: [name] MRN: [id number] Date of Birth: 06-26-1946  [facility] CM/SW Contact:  Glennon Mac, RN Phone Number: 06/21/2020, 3:20 PM   Clinical Narrative:  Patient is a 74 y/o femlae with recent stay for R cerebellar infarct admitted for stent assisted angioplasty of severely stenotic prox basilar artery due to stenosis.  PTA, pt fairly independent and living with adult children.  PT recommending HH follow up, but pt uninsured; unable to secure charity care due to staffing issues with charity HH issues.  Pt and PA agreeable with OP therapy; referral made to [facility] for PT/OT follow up.  Referral faxed to rehab center; they will call pt for an appointment.       Final next level of care: OP Rehab Barriers to Discharge: Barriers Resolved            Discharge Plan and Services   Discharge Planning Services: CM Consult                                 Social Determinants of Health (SDOH) Interventions     Readmission Risk Interventions No flowsheet data found.   Quintella Baton, RN, BSN  Trauma/Neuro ICU Case Manager [phone]

## 2020-06-21 NOTE — Evaluation (Signed)
Physical Therapy Evaluation Patient Details Name: Andrea Maxwell MRN: [id number] DOB: 1946/11/20 Today's Date: 06/21/2020   History of Present Illness  Patient is a 74 y/o femlae with recent stay for R cerebellar infarct admitted for stent assisted angioplasty of severely stenotic prox basilar artery due to stenosis.  PMH positive for pre-diabetes and HTN.  Clinical Impression  Patient presents with decreased balance and now requiring RW and S for balance/safety.  Previously she was independent and enjoyed gardening.  Feel she could benefit from follow up [place] if able to get charity assistance.  Has family support so no further recommendations at this time.     Follow Up Recommendations Home health PT;Supervision for mobility/OOB (if can qualify for charity care)    Equipment Recommendations  None recommended by PT    Recommendations for Other Services       Precautions / Restrictions Precautions Precautions: Fall      Mobility  Bed Mobility Overal bed mobility: Needs Assistance Bed Mobility: Supine to Sit     Supine to sit: Min assist     General bed mobility comments: pulled up on PT with HHA to lift trunk, increased time to scoot hips  Transfers Overall transfer level: Needs assistance Equipment used: Rolling walker (2 wheeled);None Transfers: Sit to/from Stand Sit to Stand: Supervision;Min guard Stand pivot transfers: Supervision       General transfer comment: no walker initially to bathroom with A for balance, then with RW and S from EOB  Ambulation/Gait Ambulation/Gait assistance: Supervision;Min assist Gait Distance (Feet): 150 Feet Assistive device: Rolling walker (2 wheeled);None Gait Pattern/deviations: Step-to pattern;Step-through pattern;Decreased stride length;Shuffle;Wide base of support     General Gait Details: shuffling steps wearing her slippers from home, able to improve with cues for foot clearance; to bathroom initially no walker min A,  then with RW and S  Stairs Stairs: Yes Stairs assistance: Min assist Stair Management: Step to pattern;Forwards;One rail Left Number of Stairs: 5 General stair comments: and with HHA on R  Wheelchair Mobility    Modified Rankin (Stroke Patients Only) Modified Rankin (Stroke Patients Only) Pre-Morbid Rankin Score: No significant disability Modified Rankin: Moderately severe disability     Balance Overall balance assessment: Needs assistance   Sitting balance-Leahy Scale: Good     Standing balance support: No upper extremity supported;During functional activity Standing balance-Leahy Scale: Fair Standing balance comment: standing to wash hands and for performing toilet hygiene with S, no UE support                             Pertinent Vitals/Pain Pain Assessment: Faces Faces Pain Scale: Hurts little more Pain Location: R groin from procedure Pain Descriptors / Indicators: Sore Pain Intervention(s): Monitored during session;Repositioned    Home Living Family/patient expects to be discharged to:: Private residence Living Arrangements: Children Available Help at Discharge: Family;Available PRN/intermittently Type of Home: House Home Access: Stairs to enter Entrance Stairs-Rails: Can reach both Entrance Stairs-Number of Steps: 5 Home Layout: Two level;Able to live on main level with bedroom/bathroom Home Equipment: Walker - 2 wheels      Prior Function           Comments: using walker since her stroke, was active and gardening previous to stroke     Hand Dominance   Dominant Hand: Right    Extremity/Trunk Assessment   Upper Extremity Assessment Upper Extremity Assessment: Overall WFL for tasks assessed    Lower Extremity Assessment RLE  Deficits / Details: ROM WFL/strength grossly 4/5 RLE Sensation: WNL LLE Deficits / Details: ROM WFL/strength grossly 4/5 LLE Sensation: WNL    Cervical / Trunk Assessment Cervical / Trunk Assessment:  Kyphotic  Communication   Communication: Prefers language other than English ([place]ipino, but understands most [place])  Cognition Arousal/Alertness: Awake/alert Behavior During Therapy: WFL for tasks assessed/performed Overall Cognitive Status: Within Functional Limits for tasks assessed                                        General Comments General comments (skin integrity, edema, etc.): VSS on RA, close family friend in the room (like her daughter)    Exercises     Assessment/Plan    PT Assessment All further PT needs can be met in the next venue of care  PT Problem List         PT Treatment Interventions      PT Goals (Current goals can be found in the Care Plan section)  Acute Rehab PT Goals PT Goal Formulation: All assessment and education complete, DC therapy    Frequency     Barriers to discharge        Co-evaluation               AM-PAC PT "6 Clicks" Mobility  Outcome Measure Help needed turning from your back to your side while in a flat bed without using bedrails?: None Help needed moving from lying on your back to sitting on the side of a flat bed without using bedrails?: A Little Help needed moving to and from a bed to a chair (including a wheelchair)?: A Little Help needed standing up from a chair using your arms (e.g., wheelchair or bedside chair)?: A Little Help needed to walk in hospital room?: None Help needed climbing 3-5 steps with a railing? : A Little 6 Click Score: 20    End of Session   Activity Tolerance: Patient tolerated treatment well Patient left: in chair;with family/visitor present   PT Visit Diagnosis: Other abnormalities of gait and mobility (R26.89);Other symptoms and signs involving the nervous system (R29.898)    Time: 1000-1028 PT Time Calculation (min) (ACUTE ONLY): 28 min   Charges:   PT Evaluation $PT Eval Moderate Complexity: 1 Mod PT Treatments $Gait Training: 8-22 mins        Magda Kiel, PT Acute Rehabilitation Services [facility]:[phone] [phone] 06/21/2020   Andrea Maxwell 06/21/2020, 10:49 AM

## 2020-06-21 NOTE — Discharge Instructions (Signed)
Femoral/Radial Site Care °This sheet gives you information about how to care for yourself after your procedure. Your health care provider may also give you more specific instructions. If you have problems or questions, contact your health care provider. °What can I expect after the procedure? °After the procedure, it is common to have: °· Bruising that usually fades within 1-2 weeks. °· Tenderness at the site. °Follow these instructions at home: °Wound care °1. Follow instructions from your health care provider about how to take care of your insertion site. Make sure you: °? Wash your hands with soap and water before you change your bandage (dressing). If soap and water are not available, use hand sanitizer. °? Change your dressing as directed- pressure dressing removed 24 hours post-procedure (and switch for bandaid), bandaid removed 72 hours post-procedure °2. Do not take baths, swim, or use a hot tub for 7 days post-procedure. °3. You may shower 48 hours after the procedure or as told by your health care provider. °? Gently wash the site with plain soap and water. °? Pat the area dry with a clean towel. °? Do not rub the site. This may cause bleeding. °4. Check your femoral site every day for signs of infection. Check for: °? Redness, swelling, or pain. °? Fluid or blood. °? Warmth. °? Pus or a bad smell. °Activity °· Do not stoop, bend, or lift anything that is heavier than 10 lb (4.5 kg) for 2 weeks post-procedure. °· Do not drive self for 2 weeks post-procedure. °Contact a health care provider if you have: °· A fever or chills. °· You have redness, swelling, or pain around your insertion site. °Get help right away if: °· The catheter insertion area swells very fast. °· You pass out. °· You suddenly start to sweat or your skin gets clammy. °· The catheter insertion area is bleeding, and the bleeding does not stop when you hold steady pressure on the area. °· The area near or just beyond the catheter insertion  site becomes pale, cool, tingly, or numb. °These symptoms may represent a serious problem that is an emergency. Do not wait to see if the symptoms will go away. Get medical help right away. Call your local emergency services (911 in the [place] Do not drive yourself to the hospital. ° °This information is not intended to replace advice given to you by your health care provider. Make sure you discuss any questions you have with your health care provider. °Document Revised: 12/15/2017 Document Reviewed: 12/15/2017 °Elsevier Patient Education © 2020 [employer] °

## 2020-06-21 NOTE — Progress Notes (Signed)
NIR Brief Rounding Note:  History of proximal basilar artery stenosis s/p revascularization of proximal basilar artery stenosis using stent assisted angioplasty via right radial and right femoral approach 06/20/2020 by Dr. Corliss Skains.  Went to evaluate patient bedside. Patient awake and alert sitting in bed with no complaints at this time. Denies headache, weakness, or right wrist pain. RN with patient mobility safety concerns prior to discharge.  Alert, awake, and oriented x3. Speech and comprehension intact. PERRL bilaterally. No facial asymmetry. Tongue midline. Motor power symmetric proportional to effort. No pronator drift. Fine motor and coordination intact and symmetric. Distal pulses- radial 2+ bilaterally; DPs 1+ bilaterally. Right femoral puncture site soft without active bleeding or hematoma, minimal ecchymosis at lateral aspect of puncture site. Right radial puncture site without active bleeding or hematoma, palpable firmness noted proximal to puncture site, less prominent and non-tender when compared to yesterday.  PT/OT assessment prior to D/C. Ok to remove arterial line. OOB as tolerated. Plan for discharge today pending PT/OT assessment. Dr. Corliss Skains updated on above plans. NIR to follow.   Waylan Boga Yudith Norlander, PA-C 06/21/2020, 10:09 AM

## 2020-06-21 NOTE — Evaluation (Addendum)
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  PMH positive for pre-diabetes and HTN.   Clinical Impression   Pt presents with above diagnoses, with recent admission for R cerebellar infarct. Pt had been using RW at home, but otherwise at mod I level. Pt completed transfers at supervision assist and sit <> stands with supervision assist and RW. She completed functional mobility with RW beyond a household distance without notable balance deficits or safety concerns. Educated pt and family on shower chair appropriate for pt in home, helped research where to purchase. Educated pt on other means of ECS strategies for IADLs (seated rest breaks, pacing) due to some DOE 2/4 with RR up to 30 with activity. Recommend HHOT to aide in independent IADLs such as gardening, but pt likely to refuse stating she will exercise herself at home. Recommend pt follow up with next venue of care. OT will sign off in anticipation for d/c.     Follow Up Recommendations  Home health OT (pt likely to refuse, stating she will exercise herself at home)    Equipment Recommendations  Tub/shower seat    Recommendations for Other Services       Precautions / Restrictions Precautions Precautions: Fall Restrictions Weight Bearing Restrictions: No      Mobility Bed Mobility               General bed mobility comments: up in chair  Transfers Overall transfer level: Needs assistance Equipment used: Rolling walker (2 wheeled);None Transfers: Sit to/from Stand Sit to Stand: Supervision Stand pivot transfers: Supervision       General transfer comment: supervision overall for safety. RW used for longer distances    Balance Overall  balance assessment: Needs assistance   Sitting balance-Leahy Scale: Good     Standing balance support: No upper extremity supported;During functional activity Standing balance-Leahy Scale: Fair                             ADL either performed or assessed with clinical judgement   ADL Overall ADL's : Modified independent                                       General ADL Comments: Pt is at mod I level for BADL this date. Educated pt and family on use of shower chair in walk in shower- helped family research which option to buy that is safest for pt. Pt able to complete functional mobility beyond household distance with use of RW at mod I level.     Vision Baseline Vision/History: No visual deficits Patient Visual Report: No change from baseline Vision Assessment?: No apparent visual deficits     Perception     Praxis      Pertinent Vitals/Pain Pain Assessment: Faces Faces Pain Scale: Hurts a little bit Pain Location: R groin from procedure Pain Descriptors / Indicators: Sore Pain Intervention(s): Monitored during session;Repositioned     Hand Dominance Right   Extremity/Trunk Assessment Upper Extremity Assessment Upper Extremity Assessment: Overall WFL for tasks assessed   Lower Extremity Assessment Lower Extremity Assessment: Defer to PT evaluation  Communication Communication Communication: Prefers language other than English (speaks functional english)   Cognition Arousal/Alertness: Awake/alert Behavior During Therapy: WFL for tasks assessed/performed Overall Cognitive Status: Within Functional Limits for tasks assessed                                     General Comments       Exercises     Shoulder Instructions      Home Living Family/patient expects to be discharged to:: Private residence Living Arrangements: Children Available Help at Discharge: Family;Available PRN/intermittently Type of Home:  House Home Access: Stairs to enter [employer] of Steps: 5 Entrance Stairs-Rails: Can reach both Home Layout: Two level;Able to live on main level with bedroom/bathroom     Bathroom Shower/Tub: Chief Strategy Officer: Standard     Home Equipment: Environmental consultant - 2 wheels          Prior Functioning/Environment Level of Independence: Independent with assistive device(s)        Comments: using walker since her stroke, was active and gardening previous to stroke        OT Problem List: Decreased knowledge of use of DME or AE;Decreased activity tolerance;Impaired balance (sitting and/or standing)      OT Treatment/Interventions:      OT Goals(Current goals can be found in the care plan section) Acute Rehab OT Goals Patient Stated Goal: return to gardening OT Goal Formulation: All assessment and education complete, DC therapy  OT Frequency:     Barriers to D/C:            Co-evaluation              AM-PAC OT "6 Clicks" Daily Activity     Outcome Measure Help from another person eating meals?: None Help from another person taking care of personal grooming?: None Help from another person toileting, which includes using toliet, bedpan, or urinal?: None Help from another person bathing (including washing, rinsing, drying)?: None Help from another person to put on and taking off regular upper body clothing?: None Help from another person to put on and taking off regular lower body clothing?: None 6 Click Score: 24   End of Session Equipment Utilized During Treatment: Rolling walker Nurse Communication: Mobility status  Activity Tolerance: Patient tolerated treatment well Patient left: in chair;with call bell/phone within reach;with family/visitor present  OT Visit Diagnosis: Other abnormalities of gait and mobility (R26.89);Muscle weakness (generalized) (M62.81)                Time: 9323-5573 OT Time Calculation (min): 15 min Charges:  OT  General Charges $OT Visit: 1 Visit OT Evaluation $OT Eval Low Complexity: 1 Low  Dalphine Handing, MSOT, OTR/L Acute Rehabilitation Services [facility] Office Number: [phone] Pager: [phone]  Dalphine Handing 06/21/2020, 3:31 PM

## 2020-06-21 NOTE — Discharge Summary (Signed)
Patient ID: Andrea Maxwell MRN: [id number] DOB/AGE: Apr 19, 1946 74 y.o.  Admit date: 06/20/2020 Discharge date: 06/21/2020  Supervising Physician: Julieanne Cotton  Patient Status: [facility] - In-pt  Admission Diagnoses: Proximal basilar artery stenosis   Discharge Diagnoses:  Active Problems:   Basilar artery stenosis   Discharged Condition: Stable  Hospital Course: Patient presented to [facility] 06/20/2020 for an image-guided cerebral arteriogram with revascularization of proximal basilar artery stenosis using stent assisted angioplasty via right femoral and right radial approach by Dr. Corliss Skains.  Post-procedure the patient did develop right forearm hematoma and oozing from the puncture site. Air was added to the TR band and the IV heparin was held for four hours. The patient also developed oozing from the right femoral site later in the evening. Pressure was held and hemostatis was achieved.  The patient had an otherwise unremarkable night. The right forearm hematoma was decreased during our morning assessment and the right femoral site was stable.  PT/OT and Social work consulted for discharge preparation. Physical therapy recommended home health PT but due to patient finances and staffing limitations the patient will be set up for outpatient rehab.    Consults: PT/OT  Significant Diagnostic Studies: IR US Guide Vasc Access Right  Result Date: 06/20/2020 CLINICAL DATA:  Vertebrobasilar insufficiency with symptoms of dizziness and visual blurring. Basilar artery stenosis on CT angiogram of the head and neck. EXAM: IR ULTRASOUND GUIDANCE VASC ACCESS RIGHT COMPARISON:  CT angiogram of the head and neck of June 14, 2020. MEDICATIONS: Heparin 2000 units IA. No antibiotic was administered within 1 hour of the procedure. ANESTHESIA/SEDATION: Versed 1 mg IV; Fentanyl 50 mcg IV Moderate Sedation Time:  64 minutes The patient was continuously monitored during the procedure by the interventional  radiology nurse under my direct supervision. CONTRAST:  Isovue 300 approximately 65 mL FLUOROSCOPY TIME:  Fluoroscopy Time: 23 minutes 48 seconds (901 mGy). COMPLICATIONS: None immediate. TECHNIQUE: Informed written consent was obtained from the patient after a thorough discussion of the procedural risks, benefits and alternatives. All questions were addressed. Maximal Sterile Barrier Technique was utilized including caps, mask, sterile gowns, sterile gloves, sterile drape, hand hygiene and skin antiseptic. A timeout was performed prior to the initiation of the procedure. The right forearm to the wrist was prepped and draped in the usual sterile manner. The right radial artery was interrogated with ultrasound and its morphology documented. A dorsal palmar anastomosis was verified to be present. Using ultrasound guidance and micropuncture set right trans radial access was obtained over a 0.018 inch micro guidewire. Over the micro guidewire, a 4/5 French radial sheath was then inserted. The obturator, and the micro guidewire were removed. Good aspiration was obtained from the side port of the sheath. A cocktail of 2000 units of heparin, 2.5 mg of verapamil, and 200 mcg of nitroglycerin were then infused through the sheath in diluted form without event. A right radial arteriogram was performed. Over a 0.035 inch Roadrunner guidewire, a 5 [place] Simmons 2 diagnostic catheter was advanced and positioned in the right vertebral artery origin, the left common carotid artery, the right common carotid artery, and the left subclavian artery. Following the procedure, hemostasis was achieved at the right radial puncture site with a wrist band. The distal right radial pulse was verified to be present. FINDINGS: The right vertebral artery origin is widely patent. The vessel is seen to opacify to the cranial skull base. Patency is seen of the right vertebrobasilar junction with a hypoplastic right posterior-inferior cerebellar  artery.  There is a severe proximal basilar artery stenosis secondary to smooth circumferential plaque. Distal to this there is mild tortuosity of the distal basilar artery. The superior cerebellar arteries and the anterior-inferior cerebellar arteries opacify into the capillary and venous phases. Transient opacification of the left posterior cerebral artery is seen. The left vertebral artery origin is widely patent. The vessel demonstrates mild tortuosity just distal to its origin. More distally the vessel opacifies to the cranial skull base. Wide patency is seen of the left vertebrobasilar junction and the left posterior-inferior cerebellar artery. The proximal basilar artery again demonstrates severe pre occlusive stenosis. Distal to this opacification is seen into the distal basilar artery, the superior cerebellar arteries and the anterior-inferior cerebellar arteries. The left common carotid arteriogram demonstrates the left external carotid artery and its major branches to be widely patent. The left internal carotid artery at the bulb to the cranial skull base is widely patent. The petrous, cavernous and supraclinoid segments are widely patent. The left posterior communicating artery is seen opacifying the left posterior cerebral artery distribution. The left middle cerebral artery and the left anterior cerebral artery opacify into the capillary and venous phases. There is approximately 50% stenosis at the origin of the inferior division of the left middle cerebral artery. The right common carotid arteriogram demonstrates the right external carotid artery and its major branches to be widely patent. The right internal carotid artery at the bulb to the cranial skull base also demonstrates wide patency. The petrous, cavernous and the supraclinoid segments are widely patent. The right middle cerebral artery and the right anterior cerebral artery opacify into the capillary and venous phases. Sinus phase demonstrates  non opacification of the proximal left transverse sinus. 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 Poor visualization of the right posterior cerebral artery. 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 PLAN: Findings reviewed with the patient and the patient's neurologist. Electronically Signed   By: Julieanne Cotton M.D.   On: 06/15/2020 14:12   IR ANGIO INTRA EXTRACRAN SEL COM CAROTID INNOMINATE BILAT MOD SED  Result Date: 06/20/2020 CLINICAL DATA:  Vertebrobasilar insufficiency with symptoms of dizziness and visual blurring. Basilar artery stenosis on CT angiogram of the head and neck. EXAM: IR ULTRASOUND GUIDANCE VASC ACCESS RIGHT COMPARISON:  CT angiogram of the head and neck of June 14, 2020. MEDICATIONS: Heparin 2000 units IA. No antibiotic was administered within 1 hour of the procedure. ANESTHESIA/SEDATION: Versed 1 mg IV; Fentanyl 50 mcg IV Moderate Sedation Time:  64 minutes The patient was continuously monitored during the procedure by the interventional radiology nurse under my direct supervision. CONTRAST:  Isovue 300 approximately 65 mL FLUOROSCOPY TIME:  Fluoroscopy Time: 23 minutes 48 seconds (901 mGy). COMPLICATIONS: None immediate. TECHNIQUE: Informed written consent was obtained from the patient after a thorough discussion of the procedural risks, benefits and alternatives. All questions were addressed. Maximal Sterile Barrier Technique was utilized including caps, mask, sterile gowns, sterile gloves, sterile drape, hand hygiene and skin antiseptic. A timeout was performed prior to the initiation of the procedure. The right forearm to the wrist was prepped and draped in the usual sterile manner. The right radial artery was interrogated with ultrasound and its morphology documented. A dorsal palmar anastomosis was verified to be present. Using ultrasound guidance  and micropuncture set right trans radial access was obtained over a 0.018 inch micro guidewire. Over the micro guidewire, a 4/5 [place]  radial sheath was then inserted. The obturator, and the micro guidewire were removed. Good aspiration was obtained from the side port of the sheath. A cocktail of 2000 units of heparin, 2.5 mg of verapamil, and 200 mcg of nitroglycerin were then infused through the sheath in diluted form without event. A right radial arteriogram was performed. Over a 0.035 inch Roadrunner guidewire, a 5 [place] Simmons 2 diagnostic catheter was advanced and positioned in the right vertebral artery origin, the left common carotid artery, the right common carotid artery, and the left subclavian artery. Following the procedure, hemostasis was achieved at the right radial puncture site with a wrist band. The distal right radial pulse was verified to be present. FINDINGS: The right vertebral artery origin is widely patent. The vessel is seen to opacify to the cranial skull base. Patency is seen of the right vertebrobasilar junction with a hypoplastic right posterior-inferior cerebellar artery. There is a severe proximal basilar artery stenosis secondary to smooth circumferential plaque. Distal to this there is mild tortuosity of the distal basilar artery. The superior cerebellar arteries and the anterior-inferior cerebellar arteries opacify into the capillary and venous phases. Transient opacification of the left posterior cerebral artery is seen. The left vertebral artery origin is widely patent. The vessel demonstrates mild tortuosity just distal to its origin. More distally the vessel opacifies to the cranial skull base. Wide patency is seen of the left vertebrobasilar junction and the left posterior-inferior cerebellar artery. The proximal basilar artery again demonstrates severe pre occlusive stenosis. Distal to this opacification is seen into the distal basilar artery, the superior cerebellar  arteries and the anterior-inferior cerebellar arteries. The left common carotid arteriogram demonstrates the left external carotid artery and its major branches to be widely patent. The left internal carotid artery at the bulb to the cranial skull base is widely patent. The petrous, cavernous and supraclinoid segments are widely patent. The left posterior communicating artery is seen opacifying the left posterior cerebral artery distribution. The left middle cerebral artery and the left anterior cerebral artery opacify into the capillary and venous phases. There is approximately 50% stenosis at the origin of the inferior division of the left middle cerebral artery. The right common carotid arteriogram demonstrates the right external carotid artery and its major branches to be widely patent. The right internal carotid artery at the bulb to the cranial skull base also demonstrates wide patency. The petrous, cavernous and the supraclinoid segments are widely patent. The right middle cerebral artery and the right anterior cerebral artery opacify into the capillary and venous phases. Sinus phase demonstrates non opacification of the proximal left transverse sinus. 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 Poor visualization of the right posterior cerebral artery. 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 PLAN: Findings reviewed with the patient and the patient's neurologist. Electronically Signed   By: Julieanne Cotton M.D.   On: 06/15/2020 14:12   IR ANGIO VERTEBRAL SEL VERTEBRAL UNI R MOD SED  Result Date: 06/21/2020 CLINICAL DATA:  Vertebrobasilar insufficiency with symptoms of dizziness and visual blurring. Basilar artery stenosis on CT angiogram of the head and neck.  EXAM: IR ULTRASOUND GUIDANCE VASC ACCESS RIGHT  COMPARISON:  CT angiogram of the head and neck of June 14, 2020.   MEDICATIONS: Heparin 2000 units IA. No antibiotic was administered within 1 hour of the procedure.  ANESTHESIA/SEDATION: Versed 1 mg IV; Fentanyl 50 mcg IV  Moderate  Sedation Time:  64 minutes  The patient was continuously monitored during the procedure by the interventional radiology nurse under my direct supervision.  CONTRAST:  Isovue 300 approximately 65 mL  FLUOROSCOPY TIME:  Fluoroscopy Time: 23 minutes 48 seconds (901 mGy).  COMPLICATIONS: None immediate.  TECHNIQUE: Informed written consent was obtained from the patient after a thorough discussion of the procedural risks, benefits and alternatives. All questions were addressed. Maximal Sterile Barrier Technique was utilized including caps, mask, sterile gowns, sterile gloves, sterile drape, hand hygiene and skin antiseptic. A timeout was performed prior to the initiation of the procedure.  The right forearm to the wrist was prepped and draped in the usual sterile manner.  The right radial artery was interrogated with ultrasound and its morphology documented. A dorsal palmar anastomosis was verified to be present. Using ultrasound guidance and micropuncture set right trans radial access was obtained over a 0.018 inch micro guidewire. Over the micro guidewire, a 4/5 French radial sheath was then inserted. The obturator, and the micro guidewire were removed. Good aspiration was obtained from the side port of the sheath. A cocktail of 2000 units of heparin, 2.5 mg of verapamil, and 200 mcg of nitroglycerin were then infused through the sheath in diluted form without event. A right radial arteriogram was performed.  Over a 0.035 inch Roadrunner guidewire, a 5 [place] Simmons 2 diagnostic catheter was advanced and positioned in the right vertebral artery origin, the left common carotid artery, the right common carotid artery, and the left subclavian artery.  Following the procedure, hemostasis was achieved at the right radial puncture site with a wrist  band. The distal right radial pulse was verified to be present.  FINDINGS: The right vertebral artery origin is widely patent.  The vessel is seen to opacify to the cranial skull base. Patency is seen of the right vertebrobasilar junction with a hypoplastic right posterior-inferior cerebellar artery.  There is a severe proximal basilar artery stenosis secondary to smooth circumferential plaque. Distal to this there is mild tortuosity of the distal basilar artery. The superior cerebellar arteries and the anterior-inferior cerebellar arteries opacify into the capillary and venous phases. Transient opacification of the left posterior cerebral artery is seen.  The left vertebral artery origin is widely patent.  The vessel demonstrates mild tortuosity just distal to its origin. More distally the vessel opacifies to the cranial skull base. Wide patency is seen of the left vertebrobasilar junction and the left posterior-inferior cerebellar artery.  The proximal basilar artery again demonstrates severe pre occlusive stenosis. Distal to this opacification is seen into the distal basilar artery, the superior cerebellar arteries and the anterior-inferior cerebellar arteries.  The left common carotid arteriogram demonstrates the left external carotid artery and its major branches to be widely patent.  The left internal carotid artery at the bulb to the cranial skull base is widely patent.  The petrous, cavernous and supraclinoid segments are widely patent.  The left posterior communicating artery is seen opacifying the left posterior cerebral artery distribution.  The left middle cerebral artery and the left anterior cerebral artery opacify into the capillary and venous phases.  There is approximately 50% stenosis at the origin of the inferior division of the left middle cerebral artery.  The right common carotid arteriogram demonstrates the right external carotid artery and its major branches to be widely patent.   The right internal carotid artery at the bulb to the cranial skull base also demonstrates wide patency. The petrous, cavernous and the supraclinoid  segments are widely patent.  The right middle cerebral artery and the right anterior cerebral artery opacify into the capillary and venous phases. Sinus phase demonstrates non opacification of the proximal left transverse sinus.  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  Poor visualization of the right posterior cerebral artery.  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  PLAN: Findings reviewed with the patient and the patient's neurologist.   Electronically Signed   By: Julieanne Cotton M.D.   On: 06/15/2020 14:12   Treatments: stent-assisted angioplasty of basilar artery  Discharge Exam: Blood pressure 118/70, pulse 81, temperature 98.8 F (37.1 C), temperature source Oral, resp. rate 18, height 5\' 1"  (1.549 m), weight 130 lb (59 kg), SpO2 95 %. Physical Exam Constitutional:      General: She is not in acute distress.    Appearance: She is well-developed.  HENT:     Mouth/Throat:     Mouth: Mucous membranes are moist.  Cardiovascular:     Rate and Rhythm: Normal rate and regular rhythm.     Pulses:          Radial pulses are 2+ on the right side and 2+ on the left side.       Dorsalis pedis pulses are 1+ on the right side and 1+ on the left side.  Pulmonary:     Effort: Pulmonary effort is normal.  Skin:    General: Skin is warm and dry.     Comments: Right femoral puncture site soft without active bleeding or hematoma, minimal ecchymosis at lateral aspect of puncture site. Right radial puncture site without active bleeding or hematoma, palpable firmness noted proximal to puncture site, less prominent and non-tender when compared to yesterday.  Neurological:     Mental Status: She is alert and  oriented to person, place, and time.     Cranial Nerves: No facial asymmetry.     Sensory: Sensation is intact.     Motor: Motor function is intact.     Coordination: Coordination is intact.  Psychiatric:        Attention and Perception: Attention normal.        Mood and Affect: Affect normal.        Speech: Speech normal.        Behavior: Behavior normal.        Thought Content: Thought content normal.        Cognition and Memory: Cognition normal.        Judgment: Judgment normal.     Disposition:  Discharge disposition: 01-Home or Self Care       Discharge Instructions    Call MD for:  difficulty breathing, headache or visual disturbances   Complete by: As directed    Call MD for:  extreme fatigue   Complete by: As directed    Call MD for:  hives   Complete by: As directed    Call MD for:  persistant dizziness or light-headedness   Complete by: As directed    Call MD for:  persistant nausea and vomiting   Complete by: As directed    Call MD for:  redness, tenderness, or signs of infection (pain, swelling, redness, odor or green/yellow discharge around incision site)   Complete by: As directed    Call MD for:  severe uncontrolled pain   Complete by: As directed    Call MD for:  temperature >100.4  Complete by: As directed    Diet - low sodium heart healthy   Complete by: As directed    Discharge instructions   Complete by: As directed    Continue taking 75 mg Plavix once daily. Continue taking 81 mg aspirin once daily. No bending, stooping or lifting more than 10 pounds for two weeks. No driving self for two weeks. Stay hydrated by drinking plenty of water.   Increase activity slowly   Complete by: As directed    Remove dressing in 24 hours   Complete by: As directed    Removing dressings following first shower. See shower instructions for more details.     Allergies as of 06/21/2020   No Known Allergies     Medication List    TAKE these medications     aspirin 325 MG EC tablet Take 1 tablet (325 mg total) by mouth daily.   atorvastatin 80 MG tablet Commonly known as: LIPITOR Take 1 tablet (80 mg total) by mouth daily.   clopidogrel 75 MG tablet Commonly known as: PLAVIX Take 1 tablet (75 mg total) by mouth daily.   FISH OIL PO Take 1 capsule by mouth in the morning and at bedtime.   lisinopril 20 MG tablet Commonly known as: ZESTRIL Take 20 mg by mouth daily.   metoprolol tartrate 25 MG tablet Commonly known as: LOPRESSOR Take 25 mg by mouth 2 (two) times daily.       Follow-up Information    [facility] Follow up.   Why: Physical and occupational therapy.  Rehab center will call you for an appointment or you may call to schedule Contact information: [address]  [place], [place] Phone:  [phone]       Julieanne Cotton, MD Follow up.   Specialties: Interventional Radiology, Radiology Why: Please follow up with Dr. Corliss Skains at [facility] in two weeks. A scheduler from our office will call you to make this appointment.  Contact information: [address] [place] [address] [phone]                Electronically Signed: Mickie Kay, NP 06/21/2020, 3:53 PM   I have spent Greater Than 30 Minutes discharging Maitri [place].

## 2020-06-22 ENCOUNTER — Telehealth (HOSPITAL_COMMUNITY): Payer: Self-pay

## 2020-06-22 NOTE — Telephone Encounter (Signed)
Called to schedule 2 week f/u, no answer, vm not set up. AW

## 2020-06-29 ENCOUNTER — Telehealth (HOSPITAL_COMMUNITY): Payer: Self-pay

## 2020-06-29 NOTE — Telephone Encounter (Signed)
Called to schedule 2 wk f/u, no answer, no vm set up. AW

## 2020-07-19 ENCOUNTER — Other Ambulatory Visit: Payer: Self-pay

## 2020-07-19 ENCOUNTER — Ambulatory Visit (HOSPITAL_COMMUNITY)
Admission: RE | Admit: 2020-07-19 | Discharge: 2020-07-19 | Disposition: A | Discharge status: Home / Self Care | Payer: MEDICAID | Source: Ambulatory Visit | Attending: Student | Admitting: Student

## 2020-07-19 DIAGNOSIS — I651 Occlusion and stenosis of basilar artery: Secondary | ICD-10-CM

## 2020-07-19 NOTE — Consult Note (Signed)
Chief Complaint: Patient was seen in consultation today for follow-up to her procedure on 06/20/2020.    Referring Physician(s): Covington,Jamie R   History of Present Illness: Nadira Ramone is a 74 y.o. 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  The patient and her daughter  reports significant improvement in her neurological condition.  Denies the patient having any further episodes of vertebrobasilar insufficiency particularly dizziness and vertigo with postural changes, or visual motor sensory speech swallowing difficulties.  Patient denies specifically pulsatile tinnitus, or loss of hearing.  According to the daughter the patient is totally independent without any need of assistance with her daily activities.  She denies any episodes of loss of consciousness or seizures.  The patient is independent mobile without any assistance.   Past Medical History:  Diagnosis Date  . Arthritis   . Hypertension   . Stroke [facility]) 05/23/2020    Past Surgical History:  Procedure Laterality Date  . IR ANGIO INTRA EXTRACRAN SEL COM CAROTID INNOMINATE BILAT MOD SED  06/15/2020  . IR ANGIO VERTEBRAL SEL SUBCLAVIAN INNOMINATE UNI L MOD SED  06/15/2020  . IR ANGIO VERTEBRAL SEL VERTEBRAL UNI R MOD SED  06/21/2020  . IR INTRA CRAN STENT  06/20/2020  . IR US GUIDE VASC ACCESS RIGHT  06/15/2020  . IR US GUIDE VASC ACCESS RIGHT  06/20/2020  . NO PAST SURGERIES    . RADIOLOGY WITH ANESTHESIA N/A 06/20/2020   Procedure: cerebral angioplasty with stenting;  Surgeon: Julieanne Cotton, MD;  Location: [facility] OR;  Service: Radiology;  Laterality: N/A;    Allergies: Patient has no known allergies.  Medications: Prior to Admission medications   Medication Sig Start Date End Date Taking? Authorizing Provider    aspirin EC 325 MG EC tablet Take 1 tablet (325 mg total) by mouth daily. 06/16/20   Rai, Delene Ruffini, MD  atorvastatin (LIPITOR) 80 MG tablet Take 1 tablet (80 mg total) by mouth daily. 06/16/20   Rai, Delene Ruffini, MD  clopidogrel (PLAVIX) 75 MG tablet Take 1 tablet (75 mg total) by mouth daily. 06/16/20 09/14/20  Rai, Ripudeep K, MD  lisinopril (ZESTRIL) 20 MG tablet Take 20 mg by mouth daily. 05/27/20   [provider]  metoprolol tartrate (LOPRESSOR) 25 MG tablet Take 25 mg by mouth 2 (two) times daily. 05/27/20   [provider]  Omega-3 Fatty Acids (FISH OIL PO) Take 1 capsule by mouth in the morning and at bedtime.    [provider]     Family History  Problem Relation Age of Onset  . Hypertension Sister     Social History   Socioeconomic History  . Marital status: Married    Spouse name: Not on file  . Number of children: Not on file  . Years of education: Not on file  . Highest education level: Not on file  Occupational History  . Not on file  Tobacco Use  . Smoking status: Never Smoker  . Smokeless tobacco: Never Used  Vaping Use  . Vaping Use: Never used  Substance and Sexual Activity  . Alcohol use: Never  . Drug use: Never  . Sexual activity: Not on file  Other Topics Concern  . Not on file  Social History Narrative  . Not on file   Social Determinants of Health   Financial Resource Strain:   .  Difficulty of Paying Living Expenses:   Food Insecurity:   . Worried About Programme researcher, broadcasting/film/video in the Last Year:   . Barista in the Last Year:   Transportation Needs:   . Freight forwarder (Medical):   Marland Kitchen Lack of Transportation (Non-Medical):   Physical Activity:   . Days of Exercise per Week:   . Minutes of Exercise per Session:   Stress:   . Feeling of Stress :   Social Connections:   . Frequency of Communication with Friends and Family:   . Frequency of Social Gatherings with Friends and Family:   . Attends Religious Services:    . Active Member of Clubs or Organizations:   . Attends Banker Meetings:   Marland Kitchen Marital Status:      Review of Systems: A 12 point ROS discussed and pertinent positives are indicated in the HPI above.  All other systems are negative.  Review of Systems As above.  Otherwise negative for positive symptomatology. Vital Signs: There were no vitals taken for this visit.  Physical Exam Briefly patient is awake alert fully cooperative and with normal affect.  No gross lateralizing neurologic at maladies noted.  Station and gait grossly normal.    Imaging: IR Intra Cran Stent  Result Date: 06/27/2020 CLINICAL DATA:  Patient with symptomatic severe proximal basilar artery stenosis. Worsening symptoms of dizziness, gait imbalance, blurred vision, and loss of consciousness. CT angiogram of the head and neck severe pre occlusive proximal basilar artery stenosis. EXAM: IR ANGIO VERTEBRAL SEL VERTEBRAL UNI RIGHT MOD SED COMPARISON:  Diagnostic catheter arteriogram of June 15, 2020. MEDICATIONS: Heparin 3,000 units IA; Ancef 2 g IV antibiotic was administered within 1 hour of the procedure. ANESTHESIA/SEDATION: General anesthesia CONTRAST:  Isovue 300 approximately 110 mL FLUOROSCOPY TIME:  Fluoroscopy Time: 78 minutes 42 seconds (2930 mGy). COMPLICATIONS: None immediate. TECHNIQUE: Informed written consent was obtained from the patient after a thorough discussion of the procedural risks, benefits and alternatives. All questions were addressed. Maximal Sterile Barrier Technique was utilized including caps, mask, sterile gowns, sterile gloves, sterile drape, hand hygiene and skin antiseptic. A timeout was performed prior to the initiation of the procedure. Right forearm was prepped and draped in the usual sterile manner. Right radial artery was identified with ultrasound and its morphology documented. A dorsal palmar anastomosis was verified to be present. Using micropuncture set, trans radial  access was obtained with ultrasound guidance. Over a 0.018 inch micro guidewire, a 6/7 radial sheath was then inserted without event. The obturator, and the micro guidewire were removed. Good aspiration was obtained from the side port of the radial sheath. A cocktail of 2000 units of heparin, 2.5 mg of verapamil, and 200 mcg of nitroglycerin was then infused without event. A right radial artery angiogram was obtained. Over a 0.035 inch Roadrunner guidewire, a 5 [place] Simmons 2 diagnostic catheter was advanced to the innominate artery. Multiple attempts were then made to access the right vertebral artery without success on account of severe tortuous origin and proximal right vertebral artery. Following removal of the right radial sheath, hemostasis was achieved with a wrist band. Distal right radial pulse was verified to be present. The right groin was prepped and draped in the usual sterile fashion. Thereafter using modified Seldinger technique, transfemoral access into the right common femoral artery was obtained without difficulty. Over a 0.035 inch guidewire, an 8 French 25 cm Pinnacle sheath was inserted. Through this, and also over 0.035 inch  guidewire, a 5 [place] JB 1 catheter was advanced to the aortic arch region and selectively positioned in the innominate artery, and then the distal right subclavian artery. Arteriograms were performed of the innominate artery, and the right vertebral artery. FINDINGS: The innominate arteriogram demonstrates the origins of the right subclavian artery and the right common carotid artery to be widely patent. The visualized right common carotid bifurcation again demonstrates the right external, and the right internal carotid artery branches to be widely patent. The right vertebral artery origin appears widely patent with a tortuous course leading up to the right vertebrobasilar junction with patency of the right vertebrobasilar junction, and hypoplastic right  posterior-inferior cerebellar artery. Retrograde opacification of the left vertebrobasilar junction to the left PICA is seen via the right vertebral artery injection. The distal basilar artery, the superior cerebellar arteries and the anterior-inferior cerebellar arteries opacify into the capillary and venous phases. ENDOVASCULAR REVASCULARIZATION OF THE SEVERELY STENOTIC SYMPTOMATIC PROXIMAL BASILAR ARTERY WITH STENT ASSISTED ANGIOPLASTY. The diagnostic JB 1 catheter in the distal right subclavian artery was then exchanged over a 0.035 inch 300 cm Rosen exchange guidewire for a 95 cm 7 French RIST guide catheter. The guidewire was removed. Good aspiration was obtained from the hub of the 7 [place] sheath. A gentle control arteriogram performed through this demonstrates safe positioning of the tip of the sheath. This was then retrieved slightly proximal to engage the origin of the right vertebral artery. Over a 0.035 inch Roadrunner guidewire, a 7 French 115 cm Catalyst guide catheter was then advanced to the distal right vertebral artery followed by the advancement of the 7 [place] RIST sheath to the distal basilar artery. The 035 inch guidewire was removed. Good aspiration obtained from the hub of the Catalyst guide catheter. Control arteriogram performed through this again demonstrated the severe high-grade stenosis of the proximal basilar artery. Measurements were then performed of the normal basilar artery distal and also proximal to the high-grade stenosis. Over a 0.014 inch standard Synchro micro guidewire with a J-tip configuration, a 2 mm x 9 mm Gateway angioplasty balloon catheter which been prepped with 50% heparinized saline infusion and 50% contrast was then advanced to the distal end of the Catalyst guide catheter. With the micro guidewire leading with a J-tip configuration, and a torque device, access was obtained through the high-grade proximal basilar artery stenosis with the micro guidewire which was  then advanced to the distal basilar artery. This was then followed by the advancement of the Gateway balloon angioplasty catheter. The distal and proximal markers was then positioned adequate distant related to the high-grade stenosis. A control inflation was then performed using micro inflation syringe device via micro tubing in slow increments of atmospheric pressure. Initially balloon was advanced to 1.8 mm where it was maintained for approximately a minute and a half. Thereafter the balloon was deflated and control arteriogram performed through the Catalyst guide catheter which had now been advanced into the distal right vertebrobasilar junction and demonstrates significant improved caliber and flow through the angioplastied segment. There continued to be a waist which prompted a second angioplasty to 1.97 mm again as described above. This time the balloon was left inflated for 1 minute. Again it was deflated and retrieved proximally. Improved caliber was noted of the proximal basilar artery via arteriogram through the 7 [place] guide catheter. Control arteriogram performed approximately 5 minutes after the second angioplasty demonstrated mild recoil especially on the lateral projection. Due to the firmness of the plaque, a third  angioplasty was then performed with the balloon now being inflated to 1.9 mm where it was maintained for approximately 10 minutes. Following this the balloon was deflated and retrieved slightly proximally. Significantly improved caliber was seen of the angioplastied segment with free antegrade flow into the distal basilar artery with now opacification of the right posterior cerebral artery which was noted to be significantly diseased with arteriosclerosis proximally. Given the firmness of the plaque it was decided to proceed with placement of a stent. Over the 014 inch Synchro micro guidewire, the balloon was advanced to the distal basilar artery. The balloon was then exchanged for a 300  cm 014 inch Zoom exchange micro guidewire. The guidewire had a J configuration at the distal soft aspect. A control arteriogram performed through the Catalyst guide catheter demonstrated safe position of the tip of the exchange micro guidewire. Measurements were again performed of the basilar artery distally and proximal to the angioplastied segment. It was decided to place a 2 mm x 8 mm Resolute Onyx Medtronic balloon expandable stent. This was prepped and purged in the usual manner without any difficulty. Thereafter, using the rapid exchange technique, it was advanced to the waist of the proximal basilar artery. The proximal and the distal markers on the stent delivery catheter were then aligned. Thereafter, using micro inflation syringe device via micro tubing, slow inflation was then performed with a micro inflation syringe device via micro tubing. The stent was expanded to 2 mm where it was maintained for approximately 20 seconds. Balloon was then deflated and retrieved and removed. The exchange wire was maintained distally. A control arteriogram performed through the Catalyst guide catheter demonstrated excellent position of the stent distally and also proximally without any protrusion into either of the vertebral arteries. Control arteriograms were then performed 15 and 30 minutes post deployment of the stent. These continued to demonstrate excellent apposition with the no evidence of intra stent irregular filling defects. Distal branches remained patent and unchanged. An almost 100% patency of the stented segment of the proximal basilar artery was achieved. At this time the patient was also given a total 6 mg of intra-arterial Integrilin in order to obviate any formation of platelet aggregation within the stent. None was observed. The exchange micro guidewire was then gently retrieved under constant observation without any noticeable change in the stent in the proximal basilar artery. A final control  arteriogram performed through the RIST sheath at the origin of the right vertebral artery demonstrated wide patency of the right vertebral artery proximally and distally. There continued be wide patency of the stented segment of the proximal basilar artery without any change in the posterior fossa branches. The 7 French sheath was removed. A 5 [place] JB 2 catheter was then advanced over an 035 inch Roadrunner guidewire and positioned at the origin of the left vertebral artery. Control arteriogram performed at this site continued to demonstrate wide patency of the origin of the left vertebral artery with ascent of contrast to the left vertebrobasilar junction, to the left posterior-inferior cerebellar artery and the left vertebrobasilar junction. Filling of the basilar artery again demonstrated wide patency of the stented segment of the proximal basilar artery with runoff into the distal circulation. The diagnostic catheter was removed. An 8 French Angio-Seal closure device was then applied for hemostasis at the right groin puncture site. The right groin appeared soft without evidence of hematoma or bleeding. Distal pulses remained Dopplerable in the dorsalis pedis, and the posterior tibial arteries bilaterally. The patient's  ACT was maintained in the region of approximately 250 seconds throughout the procedure. Patient was given 10 mg of protamine sulfate for partial reversal of the IV heparin. The patient's general anesthesia was then reversed, and the patient was then extubated. Upon recovery, the patient declined any symptoms of headaches, nausea, vomiting or visual aberrations. She obeyed simple commands appropriately and was able to move all 4 extremities unchanged from prior to the procedure. The patient was then transferred to the PACU and then to the neuro ICU to continue on low-dose IV heparin, close monitoring of her blood pressure, and neurological checks. Late in the evening, the patient was more awake,  alert, oriented to time, place, space and appropriately responsive. Neurologically she remain intact. The following morning the IV heparin was stopped and patient was switched to aspirin 325 mg a day, and Plavix 75 mg a day. Patient was assessed by PT OT prior to her being ambulated. She was able to take oral intake without difficulty. She was asked to maintain adequate hydration, continue taking dual antiplatelets without fail, and avoid stooping, bending or lifting weights above 10 pounds for 2 weeks. The patient and daughter were advised to call 911 should she develop new neurological symptoms. They expressed understanding and agreement to the management plan. 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 PLAN: Follow-up in the clinic 2 weeks post discharge. Electronically Signed   By: Julieanne Cotton M.D.   On: 06/26/2020 10:20   IR US Guide Vasc Access Right  Result Date: 06/27/2020 CLINICAL DATA:  Patient with symptomatic severe proximal basilar artery stenosis. Worsening symptoms of dizziness, gait imbalance, blurred vision, and loss of consciousness. CT angiogram of the head and neck severe pre occlusive proximal basilar artery stenosis. EXAM: IR ANGIO VERTEBRAL SEL VERTEBRAL UNI RIGHT MOD SED COMPARISON:  Diagnostic catheter arteriogram of June 15, 2020. MEDICATIONS: Heparin 3,000 units IA; Ancef 2 g IV antibiotic was administered within 1 hour of the procedure. ANESTHESIA/SEDATION: General anesthesia CONTRAST:  Isovue 300 approximately 110 mL FLUOROSCOPY TIME:  Fluoroscopy Time: 78 minutes 42 seconds (2930 mGy). COMPLICATIONS: None immediate. TECHNIQUE: Informed written consent was obtained from the patient after a thorough discussion of the procedural risks, benefits and alternatives. All questions were addressed. Maximal Sterile Barrier Technique  was utilized including caps, mask, sterile gowns, sterile gloves, sterile drape, hand hygiene and skin antiseptic. A timeout was performed prior to the initiation of the procedure. Right forearm was prepped and draped in the usual sterile manner. Right radial artery was identified with ultrasound and its morphology documented. A dorsal palmar anastomosis was verified to be present. Using micropuncture set, trans radial access was obtained with ultrasound guidance. Over a 0.018 inch micro guidewire, a 6/7 radial sheath was then inserted without event. The obturator, and the micro guidewire were removed. Good aspiration was obtained from the side port of the radial sheath. A cocktail of 2000 units of heparin, 2.5 mg of verapamil, and 200 mcg of nitroglycerin was then infused without event. A right radial artery angiogram was obtained. Over a 0.035 inch Roadrunner guidewire, a 5 [place] Simmons 2 diagnostic catheter was advanced to the innominate artery. Multiple attempts were then made to access the right vertebral artery without success on account of severe tortuous origin and proximal right vertebral artery. Following removal of the right radial sheath, hemostasis was achieved with a wrist band. Distal right radial  pulse was verified to be present. The right groin was prepped and draped in the usual sterile fashion. Thereafter using modified Seldinger technique, transfemoral access into the right common femoral artery was obtained without difficulty. Over a 0.035 inch guidewire, an 8 French 25 cm Pinnacle sheath was inserted. Through this, and also over 0.035 inch guidewire, a 5 [place] JB 1 catheter was advanced to the aortic arch region and selectively positioned in the innominate artery, and then the distal right subclavian artery. Arteriograms were performed of the innominate artery, and the right vertebral artery. FINDINGS: The innominate arteriogram demonstrates the origins of the right subclavian artery and the  right common carotid artery to be widely patent. The visualized right common carotid bifurcation again demonstrates the right external, and the right internal carotid artery branches to be widely patent. The right vertebral artery origin appears widely patent with a tortuous course leading up to the right vertebrobasilar junction with patency of the right vertebrobasilar junction, and hypoplastic right posterior-inferior cerebellar artery. Retrograde opacification of the left vertebrobasilar junction to the left PICA is seen via the right vertebral artery injection. The distal basilar artery, the superior cerebellar arteries and the anterior-inferior cerebellar arteries opacify into the capillary and venous phases. ENDOVASCULAR REVASCULARIZATION OF THE SEVERELY STENOTIC SYMPTOMATIC PROXIMAL BASILAR ARTERY WITH STENT ASSISTED ANGIOPLASTY. The diagnostic JB 1 catheter in the distal right subclavian artery was then exchanged over a 0.035 inch 300 cm Rosen exchange guidewire for a 95 cm 7 French RIST guide catheter. The guidewire was removed. Good aspiration was obtained from the hub of the 7 [place] sheath. A gentle control arteriogram performed through this demonstrates safe positioning of the tip of the sheath. This was then retrieved slightly proximal to engage the origin of the right vertebral artery. Over a 0.035 inch Roadrunner guidewire, a 7 French 115 cm Catalyst guide catheter was then advanced to the distal right vertebral artery followed by the advancement of the 7 [place] RIST sheath to the distal basilar artery. The 035 inch guidewire was removed. Good aspiration obtained from the hub of the Catalyst guide catheter. Control arteriogram performed through this again demonstrated the severe high-grade stenosis of the proximal basilar artery. Measurements were then performed of the normal basilar artery distal and also proximal to the high-grade stenosis. Over a 0.014 inch standard Synchro micro guidewire with a  J-tip configuration, a 2 mm x 9 mm Gateway angioplasty balloon catheter which been prepped with 50% heparinized saline infusion and 50% contrast was then advanced to the distal end of the Catalyst guide catheter. With the micro guidewire leading with a J-tip configuration, and a torque device, access was obtained through the high-grade proximal basilar artery stenosis with the micro guidewire which was then advanced to the distal basilar artery. This was then followed by the advancement of the Gateway balloon angioplasty catheter. The distal and proximal markers was then positioned adequate distant related to the high-grade stenosis. A control inflation was then performed using micro inflation syringe device via micro tubing in slow increments of atmospheric pressure. Initially balloon was advanced to 1.8 mm where it was maintained for approximately a minute and a half. Thereafter the balloon was deflated and control arteriogram performed through the Catalyst guide catheter which had now been advanced into the distal right vertebrobasilar junction and demonstrates significant improved caliber and flow through the angioplastied segment. There continued to be a waist which prompted a second angioplasty to 1.97 mm again as described above. This time the balloon was  left inflated for 1 minute. Again it was deflated and retrieved proximally. Improved caliber was noted of the proximal basilar artery via arteriogram through the 7 [place] guide catheter. Control arteriogram performed approximately 5 minutes after the second angioplasty demonstrated mild recoil especially on the lateral projection. Due to the firmness of the plaque, a third angioplasty was then performed with the balloon now being inflated to 1.9 mm where it was maintained for approximately 10 minutes. Following this the balloon was deflated and retrieved slightly proximally. Significantly improved caliber was seen of the angioplastied segment with free  antegrade flow into the distal basilar artery with now opacification of the right posterior cerebral artery which was noted to be significantly diseased with arteriosclerosis proximally. Given the firmness of the plaque it was decided to proceed with placement of a stent. Over the 014 inch Synchro micro guidewire, the balloon was advanced to the distal basilar artery. The balloon was then exchanged for a 300 cm 014 inch Zoom exchange micro guidewire. The guidewire had a J configuration at the distal soft aspect. A control arteriogram performed through the Catalyst guide catheter demonstrated safe position of the tip of the exchange micro guidewire. Measurements were again performed of the basilar artery distally and proximal to the angioplastied segment. It was decided to place a 2 mm x 8 mm Resolute Onyx Medtronic balloon expandable stent. This was prepped and purged in the usual manner without any difficulty. Thereafter, using the rapid exchange technique, it was advanced to the waist of the proximal basilar artery. The proximal and the distal markers on the stent delivery catheter were then aligned. Thereafter, using micro inflation syringe device via micro tubing, slow inflation was then performed with a micro inflation syringe device via micro tubing. The stent was expanded to 2 mm where it was maintained for approximately 20 seconds. Balloon was then deflated and retrieved and removed. The exchange wire was maintained distally. A control arteriogram performed through the Catalyst guide catheter demonstrated excellent position of the stent distally and also proximally without any protrusion into either of the vertebral arteries. Control arteriograms were then performed 15 and 30 minutes post deployment of the stent. These continued to demonstrate excellent apposition with the no evidence of intra stent irregular filling defects. Distal branches remained patent and unchanged. An almost 100% patency of the  stented segment of the proximal basilar artery was achieved. At this time the patient was also given a total 6 mg of intra-arterial Integrilin in order to obviate any formation of platelet aggregation within the stent. None was observed. The exchange micro guidewire was then gently retrieved under constant observation without any noticeable change in the stent in the proximal basilar artery. A final control arteriogram performed through the RIST sheath at the origin of the right vertebral artery demonstrated wide patency of the right vertebral artery proximally and distally. There continued be wide patency of the stented segment of the proximal basilar artery without any change in the posterior fossa branches. The 7 French sheath was removed. A 5 [place] JB 2 catheter was then advanced over an 035 inch Roadrunner guidewire and positioned at the origin of the left vertebral artery. Control arteriogram performed at this site continued to demonstrate wide patency of the origin of the left vertebral artery with ascent of contrast to the left vertebrobasilar junction, to the left posterior-inferior cerebellar artery and the left vertebrobasilar junction. Filling of the basilar artery again demonstrated wide patency of the stented segment of the proximal basilar  artery with runoff into the distal circulation. The diagnostic catheter was removed. An 8 French Angio-Seal closure device was then applied for hemostasis at the right groin puncture site. The right groin appeared soft without evidence of hematoma or bleeding. Distal pulses remained Dopplerable in the dorsalis pedis, and the posterior tibial arteries bilaterally. The patient's ACT was maintained in the region of approximately 250 seconds throughout the procedure. Patient was given 10 mg of protamine sulfate for partial reversal of the IV heparin. The patient's general anesthesia was then reversed, and the patient was then extubated. Upon recovery, the patient  declined any symptoms of headaches, nausea, vomiting or visual aberrations. She obeyed simple commands appropriately and was able to move all 4 extremities unchanged from prior to the procedure. The patient was then transferred to the PACU and then to the neuro ICU to continue on low-dose IV heparin, close monitoring of her blood pressure, and neurological checks. Late in the evening, the patient was more awake, alert, oriented to time, place, space and appropriately responsive. Neurologically she remain intact. The following morning the IV heparin was stopped and patient was switched to aspirin 325 mg a day, and Plavix 75 mg a day. Patient was assessed by PT OT prior to her being ambulated. She was able to take oral intake without difficulty. She was asked to maintain adequate hydration, continue taking dual antiplatelets without fail, and avoid stooping, bending or lifting weights above 10 pounds for 2 weeks. The patient and daughter were advised to call 911 should she develop new neurological symptoms. They expressed understanding and agreement to the management plan. 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 PLAN: Follow-up in the clinic 2 weeks post discharge. Electronically Signed   By: Julieanne Cotton M.D.   On: 06/26/2020 10:20   IR US Guide Vasc Access Right  Result Date: 06/20/2020 CLINICAL DATA:  Vertebrobasilar insufficiency with symptoms of dizziness and visual blurring. Basilar artery stenosis on CT angiogram of the head and neck. EXAM: IR ULTRASOUND GUIDANCE VASC ACCESS RIGHT COMPARISON:  CT angiogram of the head and neck of June 14, 2020. MEDICATIONS: Heparin 2000 units IA. No antibiotic was administered within 1 hour of the procedure. ANESTHESIA/SEDATION: Versed 1 mg IV; Fentanyl 50 mcg IV Moderate Sedation Time:  64 minutes The patient  was continuously monitored during the procedure by the interventional radiology nurse under my direct supervision. CONTRAST:  Isovue 300 approximately 65 mL FLUOROSCOPY TIME:  Fluoroscopy Time: 23 minutes 48 seconds (901 mGy). COMPLICATIONS: None immediate. TECHNIQUE: Informed written consent was obtained from the patient after a thorough discussion of the procedural risks, benefits and alternatives. All questions were addressed. Maximal Sterile Barrier Technique was utilized including caps, mask, sterile gowns, sterile gloves, sterile drape, hand hygiene and skin antiseptic. A timeout was performed prior to the initiation of the procedure. The right forearm to the wrist was prepped and draped in the usual sterile manner. The right radial artery was interrogated with ultrasound and its morphology documented. A dorsal palmar anastomosis was verified to be present. Using ultrasound guidance and micropuncture set right trans radial access was obtained over a 0.018 inch micro guidewire. Over the micro guidewire, a 4/5 French radial sheath was then inserted. The obturator, and the micro guidewire were removed. Good aspiration was obtained from the side port of the sheath. A cocktail of 2000 units of heparin, 2.5 mg of verapamil,  and 200 mcg of nitroglycerin were then infused through the sheath in diluted form without event. A right radial arteriogram was performed. Over a 0.035 inch Roadrunner guidewire, a 5 [place] Simmons 2 diagnostic catheter was advanced and positioned in the right vertebral artery origin, the left common carotid artery, the right common carotid artery, and the left subclavian artery. Following the procedure, hemostasis was achieved at the right radial puncture site with a wrist band. The distal right radial pulse was verified to be present. FINDINGS: The right vertebral artery origin is widely patent. The vessel is seen to opacify to the cranial skull base. Patency is seen of the right  vertebrobasilar junction with a hypoplastic right posterior-inferior cerebellar artery. There is a severe proximal basilar artery stenosis secondary to smooth circumferential plaque. Distal to this there is mild tortuosity of the distal basilar artery. The superior cerebellar arteries and the anterior-inferior cerebellar arteries opacify into the capillary and venous phases. Transient opacification of the left posterior cerebral artery is seen. The left vertebral artery origin is widely patent. The vessel demonstrates mild tortuosity just distal to its origin. More distally the vessel opacifies to the cranial skull base. Wide patency is seen of the left vertebrobasilar junction and the left posterior-inferior cerebellar artery. The proximal basilar artery again demonstrates severe pre occlusive stenosis. Distal to this opacification is seen into the distal basilar artery, the superior cerebellar arteries and the anterior-inferior cerebellar arteries. The left common carotid arteriogram demonstrates the left external carotid artery and its major branches to be widely patent. The left internal carotid artery at the bulb to the cranial skull base is widely patent. The petrous, cavernous and supraclinoid segments are widely patent. The left posterior communicating artery is seen opacifying the left posterior cerebral artery distribution. The left middle cerebral artery and the left anterior cerebral artery opacify into the capillary and venous phases. There is approximately 50% stenosis at the origin of the inferior division of the left middle cerebral artery. The right common carotid arteriogram demonstrates the right external carotid artery and its major branches to be widely patent. The right internal carotid artery at the bulb to the cranial skull base also demonstrates wide patency. The petrous, cavernous and the supraclinoid segments are widely patent. The right middle cerebral artery and the right anterior  cerebral artery opacify into the capillary and venous phases. Sinus phase demonstrates non opacification of the proximal left transverse sinus. 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 Poor visualization of the right posterior cerebral artery. 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 PLAN: Findings reviewed with the patient and the patient's neurologist. Electronically Signed   By: Julieanne Cotton M.D.   On: 06/15/2020 14:12   IR ANGIO INTRA EXTRACRAN SEL COM CAROTID INNOMINATE BILAT MOD SED  Result Date: 06/20/2020 CLINICAL DATA:  Vertebrobasilar insufficiency with symptoms of dizziness and visual blurring. Basilar artery stenosis on CT angiogram of the head and neck. EXAM: IR ULTRASOUND GUIDANCE VASC ACCESS RIGHT COMPARISON:  CT angiogram of the head and neck of June 14, 2020. MEDICATIONS: Heparin 2000 units IA. No antibiotic was administered within 1 hour of the procedure. ANESTHESIA/SEDATION: Versed 1 mg IV; Fentanyl 50 mcg IV Moderate Sedation Time:  64 minutes The patient was continuously monitored during the procedure by the interventional radiology nurse under my direct supervision. CONTRAST:  Isovue 300 approximately 65 mL FLUOROSCOPY TIME:  Fluoroscopy Time: 23 minutes 48 seconds (  901 mGy). COMPLICATIONS: None immediate. TECHNIQUE: Informed written consent was obtained from the patient after a thorough discussion of the procedural risks, benefits and alternatives. All questions were addressed. Maximal Sterile Barrier Technique was utilized including caps, mask, sterile gowns, sterile gloves, sterile drape, hand hygiene and skin antiseptic. A timeout was performed prior to the initiation of the procedure. The right forearm to the wrist was prepped and draped in the usual sterile manner. The right radial artery was interrogated with ultrasound and its morphology  documented. A dorsal palmar anastomosis was verified to be present. Using ultrasound guidance and micropuncture set right trans radial access was obtained over a 0.018 inch micro guidewire. Over the micro guidewire, a 4/5 French radial sheath was then inserted. The obturator, and the micro guidewire were removed. Good aspiration was obtained from the side port of the sheath. A cocktail of 2000 units of heparin, 2.5 mg of verapamil, and 200 mcg of nitroglycerin were then infused through the sheath in diluted form without event. A right radial arteriogram was performed. Over a 0.035 inch Roadrunner guidewire, a 5 [place] Simmons 2 diagnostic catheter was advanced and positioned in the right vertebral artery origin, the left common carotid artery, the right common carotid artery, and the left subclavian artery. Following the procedure, hemostasis was achieved at the right radial puncture site with a wrist band. The distal right radial pulse was verified to be present. FINDINGS: The right vertebral artery origin is widely patent. The vessel is seen to opacify to the cranial skull base. Patency is seen of the right vertebrobasilar junction with a hypoplastic right posterior-inferior cerebellar artery. There is a severe proximal basilar artery stenosis secondary to smooth circumferential plaque. Distal to this there is mild tortuosity of the distal basilar artery. The superior cerebellar arteries and the anterior-inferior cerebellar arteries opacify into the capillary and venous phases. Transient opacification of the left posterior cerebral artery is seen. The left vertebral artery origin is widely patent. The vessel demonstrates mild tortuosity just distal to its origin. More distally the vessel opacifies to the cranial skull base. Wide patency is seen of the left vertebrobasilar junction and the left posterior-inferior cerebellar artery. The proximal basilar artery again demonstrates severe pre occlusive stenosis. Distal  to this opacification is seen into the distal basilar artery, the superior cerebellar arteries and the anterior-inferior cerebellar arteries. The left common carotid arteriogram demonstrates the left external carotid artery and its major branches to be widely patent. The left internal carotid artery at the bulb to the cranial skull base is widely patent. The petrous, cavernous and supraclinoid segments are widely patent. The left posterior communicating artery is seen opacifying the left posterior cerebral artery distribution. The left middle cerebral artery and the left anterior cerebral artery opacify into the capillary and venous phases. There is approximately 50% stenosis at the origin of the inferior division of the left middle cerebral artery. The right common carotid arteriogram demonstrates the right external carotid artery and its major branches to be widely patent. The right internal carotid artery at the bulb to the cranial skull base also demonstrates wide patency. The petrous, cavernous and the supraclinoid segments are widely patent. The right middle cerebral artery and the right anterior cerebral artery opacify into the capillary and venous phases. Sinus phase demonstrates non opacification of the proximal left transverse sinus. 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 Poor visualization of the right posterior cerebral artery. 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 PLAN: Findings reviewed with the patient and the patient's neurologist. Electronically Signed   By: Julieanne Cotton M.D.   On: 06/15/2020 14:12   IR ANGIO VERTEBRAL SEL VERTEBRAL UNI R MOD SED  Result Date: 06/21/2020 CLINICAL DATA:  Vertebrobasilar insufficiency with symptoms of dizziness and visual blurring. Basilar artery stenosis on CT angiogram of the head and neck.  EXAM: IR ULTRASOUND  GUIDANCE VASC ACCESS RIGHT  COMPARISON:  CT angiogram of the head and neck of June 14, 2020.  MEDICATIONS: Heparin 2000 units IA. No antibiotic was administered within 1 hour of the procedure.  ANESTHESIA/SEDATION: Versed 1 mg IV; Fentanyl 50 mcg IV  Moderate Sedation Time:  64 minutes  The patient was continuously monitored during the procedure by the interventional radiology nurse under my direct supervision.  CONTRAST:  Isovue 300 approximately 65 mL  FLUOROSCOPY TIME:  Fluoroscopy Time: 23 minutes 48 seconds (901 mGy).  COMPLICATIONS: None immediate.  TECHNIQUE: Informed written consent was obtained from the patient after a thorough discussion of the procedural risks, benefits and alternatives. All questions were addressed. Maximal Sterile Barrier Technique was utilized including caps, mask, sterile gowns, sterile gloves, sterile drape, hand hygiene and skin antiseptic. A timeout was performed prior to the initiation of the procedure.  The right forearm to the wrist was prepped and draped in the usual sterile manner.  The right radial artery was interrogated with ultrasound and its morphology documented. A dorsal palmar anastomosis was verified to be present. Using ultrasound guidance and micropuncture set right trans radial access was obtained over a 0.018 inch micro guidewire. Over the micro guidewire, a 4/5 French radial sheath was then inserted. The obturator, and the micro guidewire were removed. Good aspiration was obtained from the side port of the sheath. A cocktail of 2000 units of heparin, 2.5 mg of verapamil, and 200 mcg of nitroglycerin were then infused through the sheath in diluted form without event. A right radial arteriogram was performed.  Over a 0.035 inch Roadrunner guidewire, a 5 [place] Simmons 2 diagnostic catheter was advanced and positioned in the right vertebral artery origin, the left common carotid artery, the right common carotid artery, and the left subclavian artery.   Following the procedure, hemostasis was achieved at the right radial puncture site with a wrist band. The distal right radial pulse was verified to be present.  FINDINGS: The right vertebral artery origin is widely patent.  The vessel is seen to opacify to the cranial skull base. Patency is seen of the right vertebrobasilar junction with a hypoplastic right posterior-inferior cerebellar artery.  There is a severe proximal basilar artery stenosis secondary to smooth circumferential plaque. Distal to this there is mild tortuosity of the distal basilar artery. The superior cerebellar arteries and the anterior-inferior cerebellar arteries opacify into the capillary and venous phases. Transient opacification of the left posterior cerebral artery is seen.  The left vertebral artery origin is widely patent.  The vessel demonstrates mild tortuosity just distal to its origin. More distally the vessel opacifies to the cranial skull base. Wide patency is seen of the left vertebrobasilar junction and the left posterior-inferior cerebellar artery.  The proximal basilar artery again demonstrates severe pre occlusive stenosis. Distal to this opacification is seen into the distal basilar artery, the superior cerebellar arteries and the anterior-inferior cerebellar arteries.  The left common carotid arteriogram demonstrates the left external carotid artery and its major branches to be widely patent.  The left  internal carotid artery at the bulb to the cranial skull base is widely patent.  The petrous, cavernous and supraclinoid segments are widely patent.  The left posterior communicating artery is seen opacifying the left posterior cerebral artery distribution.  The left middle cerebral artery and the left anterior cerebral artery opacify into the capillary and venous phases.  There is approximately 50% stenosis at the origin of the inferior division of the left middle cerebral artery.  The right common carotid  arteriogram demonstrates the right external carotid artery and its major branches to be widely patent.  The right internal carotid artery at the bulb to the cranial skull base also demonstrates wide patency. The petrous, cavernous and the supraclinoid segments are widely patent.  The right middle cerebral artery and the right anterior cerebral artery opacify into the capillary and venous phases. Sinus phase demonstrates non opacification of the proximal left transverse sinus.  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  Poor visualization of the right posterior cerebral artery.  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  PLAN: Findings reviewed with the patient and the patient's neurologist.   Electronically Signed   By: Julieanne Cotton M.D.   On: 06/15/2020 14:12   Labs:  CBC: Recent Labs    06/15/20 0011 06/20/20 0721 06/21/20 0415  WBC 8.9 8.5 11.3*  HGB 13.5 13.3 11.5*  HCT 41.3 41.0 33.9*  PLT 211 218 208    COAGS: Recent Labs    06/15/20 0816 06/20/20 0721  INR 1.0 1.0  APTT  --  24    BMP: Recent Labs    06/15/20 0011 06/20/20 0721 06/21/20 0415  NA 140 139 141  K 3.5 4.2 3.3*  CL 106 106 111  CO2 24 23 20*  GLUCOSE 113* 108* 127*  BUN 8 12 9   CALCIUM 9.2 9.2 8.5*  CREATININE 0.66 0.77 0.60  GFRNONAA >60 >60 >60  GFRAA >60 >60 >60    LIVER FUNCTION TESTS: Recent Labs    06/15/20 0011  BILITOT 0.8  AST 17  ALT 17  ALKPHOS 80  PROT 7.3  ALBUMIN 3.5    TUMOR MARKERS: No results for input(s): AFPTM, CEA, CA199, CHROMGRNA in the last 8760 hours.  Assessment and Plan:   Plan for follow-up patient was advised to continue with her present medication namely aspirin 325 mg daily, atorvastatin 80 mg a day, Plavix 25 mg daily, lisinopril 20 mg a day, metoprolol 25 mg a day.  She was also advised to maintain adequate  hydration.  The patient and her daughter were advised to maintain contact, with her primary physician for regular checkups.   Informed patient that our schedulers will call to schedule a CT angiogram of the head and neck as a follow-up in late November 2021  All questions answered and concerns addressed. Patient conveys understanding and agrees with plan.  Thank you for this interesting consult.  I greatly enjoyed meeting Robynn Roes and look forward to participating in their care.  A copy of this report was sent to the requesting provider on this date.  Electronically Signed: Oneal Grout, MD 07/19/2020, 1:14 PM   I spent a total of 20 minutes in face to face in clinical consultation, greater than 50% of which was counseling/coordinating care for posttreatment management.

## 2020-07-27 ENCOUNTER — Encounter: Payer: Self-pay | Admitting: Adult Health

## 2020-07-27 ENCOUNTER — Ambulatory Visit: Payer: MEDICAID | Admitting: Adult Health

## 2020-07-27 VITALS — BP 131/76 | HR 64 | Ht 61.0 in | Wt 148.0 lb

## 2020-07-27 DIAGNOSIS — I651 Occlusion and stenosis of basilar artery: Secondary | ICD-10-CM

## 2020-07-27 DIAGNOSIS — I639 Cerebral infarction, unspecified: Secondary | ICD-10-CM

## 2020-07-27 DIAGNOSIS — I10 Essential (primary) hypertension: Secondary | ICD-10-CM

## 2020-07-27 DIAGNOSIS — E785 Hyperlipidemia, unspecified: Secondary | ICD-10-CM

## 2020-07-27 MED ORDER — LISINOPRIL 20 MG PO TABS: 20.0000 mg | ORAL_TABLET | Freq: Every day | ORAL | 0 refills | Status: AC

## 2020-07-27 NOTE — Progress Notes (Signed)
[facility] [address] [place]. [place] [address] [phone]       HOSPITAL FOLLOW UP NOTE  Andrea Maxwell Date of Birth:  20-Jan-1946 Medical Record Number:  [id number]   Reason for Referral:  hospital stroke follow up    SUBJECTIVE:   CHIEF COMPLAINT:  Chief Complaint  Patient presents with  . Hospitalization Follow-up    States she has been doing well since being home. Denies any new symptoms.   . treatment room    with daughter     HPI:   [name] is a 74 y.o. 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 CTA head w/ multifocal stenosis including high-grade BA stenosis.  Neuro worsening in hospital on 06/14/2020. Concern for VB insufficiency given altered responsiveness. Transferred to [facility] [facility] for further evaluation.  Stroke work-up revealed infarct likely secondary to large vessel disease source with preocclusive BA stenosis.  Evaluated by Dr. Corliss Skains with planned intervention the following week.  Recommended DAPT for 67months and then aspirin alone.  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  HTN stable and recommended long-term BP goal 130-150.  LDL 99 and initiate atorvastatin 80 mg daily.  Evaluated by therapy and discharged home in stable condition.  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   Repeat CT head [facility]) no acute abnormality  CTA head & neck [facility]) multifocal intracranial stenoses-more predominantly in the posterior circulation with the proximal basilar high-grade focal  stenosis. right PCA P1 P2 segment high-grade stenosis. moderate to severe left M2 stenosis.  MRI  [facility]) Small acute to subacute small R cerebellar infarct   Carotid Doppler  [facility]) heterogeneous minimal plaque in extracranial circulation. Anterograde vertebral flow  2D Echo [facility]) sinus rhythm, normal LV function, no LV thrombus and no defect in the interatrial or interventricular septum.   LDL 99  HgbA1c 5.9  Lovenox 40 mg sq daily for VTE prophylaxis  No antithrombotic prior to [facility] admission, now on aspirin 325 mg daily and clopidogrel 75 mg daily after loading dose. Continue DAPT x 3 months then aspirin alone.    Therapy recommendations:  No PT  Disposition:  pending    Today, 07/27/2020, [name] is being seen for hospital follow-up accompanied by her daughter.  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 Corliss Skains.  Advised to continue DAPT by Dr. Corliss Skains until repeat imaging in 73/2020  Continues on aspirin and Plavix as well as atorvastatin without side effects Blood pressure today 131/76   No concerns at this time     ROS:   14 system review of systems performed and negative with exception of no concerns  PMH:  Past Medical History:  Diagnosis Date  . Arthritis   . Hypertension   . Stroke [facility]) 05/23/2020    PSH:  Past Surgical History:  Procedure Laterality Date  . IR ANGIO INTRA EXTRACRAN SEL COM  CAROTID INNOMINATE BILAT MOD SED  06/15/2020  . IR ANGIO VERTEBRAL SEL SUBCLAVIAN INNOMINATE UNI L MOD SED  06/15/2020  . IR ANGIO VERTEBRAL SEL VERTEBRAL UNI R MOD SED  06/21/2020  . IR INTRA CRAN STENT  06/20/2020  . IR US GUIDE VASC  ACCESS RIGHT  06/15/2020  . IR US GUIDE VASC ACCESS RIGHT  06/20/2020  . NO PAST SURGERIES    . RADIOLOGY WITH ANESTHESIA N/A 06/20/2020   Procedure: cerebral angioplasty with stenting;  Surgeon: Julieanne Cotton, MD;  Location: [facility];  Service: Radiology;  Laterality: N/A;    Social History:  Social History   Socioeconomic History  . Marital status: Married    Spouse name: Not on file  . Number of children: Not on file  . Years of education: Not on file  . Highest education level: Not on file  Occupational History  . Not on file  Tobacco Use  . Smoking status: Never Smoker  . Smokeless tobacco: Never Used  Vaping Use  . Vaping Use: Never used  Substance and Sexual Activity  . Alcohol use: Never  . Drug use: Never  . Sexual activity: Not on file  Other Topics Concern  . Not on file  Social History Narrative  . Not on file   Social Determinants of Health   Financial Resource Strain:   . Difficulty of Paying Living Expenses:   Food Insecurity:   . Worried About Programme researcher, broadcasting/film/video in the Last Year:   . Barista in the Last Year:   Transportation Needs:   . Freight forwarder (Medical):   Marland Kitchen Lack of Transportation (Non-Medical):   Physical Activity:   . Days of Exercise per Week:   . Minutes of Exercise per Session:   Stress:   . Feeling of Stress :   Social Connections:   . Frequency of Communication with Friends and Family:   . Frequency of Social Gatherings with Friends and Family:   . Attends Religious Services:   . Active Member of Clubs or Organizations:   . Attends Banker Meetings:   Marland Kitchen Marital Status:   Intimate Partner Violence:   . Fear of Current or Ex-Partner:   . Emotionally Abused:   Marland Kitchen Physically Abused:   . Sexually Abused:     Family History:  Family History  Problem Relation Age of Onset  . Hypertension Sister     Medications:   Current Outpatient Medications on File Prior to Visit  Medication Sig Dispense Refill   . aspirin EC 325 MG EC tablet Take 1 tablet (325 mg total) by mouth daily. 30 tablet 3  . atorvastatin (LIPITOR) 80 MG tablet Take 1 tablet (80 mg total) by mouth daily. 30 tablet 3  . clopidogrel (PLAVIX) 75 MG tablet Take 1 tablet (75 mg total) by mouth daily. 90 tablet 0  . lisinopril (ZESTRIL) 20 MG tablet Take 20 mg by mouth daily.    . metoprolol tartrate (LOPRESSOR) 25 MG tablet Take 25 mg by mouth 2 (two) times daily.    . Omega-3 Fatty Acids (FISH OIL PO) Take 1 capsule by mouth in the morning and at bedtime.     No current facility-administered medications on file prior to visit.    Allergies:  No Known Allergies    OBJECTIVE:  Physical Exam  Vitals:   07/27/20 1248  BP: 131/76  Pulse: 64  Weight: 148 lb (67.1 kg)  Height: 5\' 1"  (1.549 m)  Body mass index is 27.96 kg/m. No exam data present  No flowsheet data found.   General: well developed, well nourished,  very pleasant elderly female, seated, in no evident distress Head: head normocephalic and atraumatic.   Neck: supple with no carotid or supraclavicular bruits Cardiovascular: regular rate and rhythm, no murmurs Musculoskeletal: no deformity Skin:  no rash/petichiae Vascular:  Normal pulses all extremities   Neurologic Exam Mental Status: Awake and fully alert. Oriented to place and time. Recent and remote memory intact. Attention span, concentration and fund of knowledge appropriate. Mood and affect appropriate.  Cranial Nerves: Fundoscopic exam reveals sharp disc margins. Pupils equal, briskly reactive to light. Extraocular movements full without nystagmus. Visual fields full to confrontation. Hearing intact. Facial sensation intact. Face, tongue, palate moves normally and symmetrically.  Motor: Normal bulk and tone. Normal strength in all tested extremity muscles. Sensory.: intact to touch , pinprick , position and vibratory sensation.  Coordination: Rapid alternating movements normal in all  extremities. Finger-to-nose and heel-to-shin performed accurately bilaterally. Gait and Station: Arises from chair without difficulty. Stance is normal. Gait demonstrates normal stride length and balance Reflexes: 1+ and symmetric. Toes downgoing.     NIHSS  0 Modified Rankin  0      ASSESSMENT: Andrea Maxwell is a 74 y.o. year old female presented to [facility] on 06/04/2020 with periorbital numbness, staring spells and decreased LOC found to have a right cerebellar infarct with neuro worsening during admission on 06/14/2020 therefore transferred to [facility] due to concern of VB insufficiency.  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 Corliss Skains.      PLAN:  1. Right cerebellar stroke:  a. Recovered well without residual deficit  b. continue aspirin 325 mg daily and clopidogrel 75 mg daily  and atorvastatin for secondary stroke prevention.  Advised ongoing DAPT and duration will be determined by IR c. Close PCP follow up for aggressive stroke risk factor management  2. BA stenosis s/p stent: a. Currently followed by Dr. Corliss Skains b. Plans on repeat imaging around 10/2020 3. HTN:  a. BP goal 130-150.  b. Stable.  Short-term refill placed for lisinopril per daughter request.  Did advise additional refills will be completed by PCP and she plans on scheduling follow-up visit in the future c. Continue f/u with PCP 4. HLD:  a. LDL goal <70. Recent LDL 99.   b. Continue atorvastatin 80 mg daily.  c. F/u with PCP for management as well as prescribing of statin     Follow up in 6 months or call earlier if needed   I spent 45 minutes of face-to-face and non-face-to-face time with patient and daughter.  This included previsit chart review, lab review, study review, order entry, electronic health record documentation, patient education  regarding recent stroke, recent IR procedure, importance of managing stroke risk factors and answered all questions to patient satisfaction     Ihor Austin, [facility]  [facility] [address] [place], [place] [address]  Phone [phone] Fax [phone] Note: This document was prepared with digital dictation and possible smart phrase technology. Any transcriptional errors that result from this process are unintentional.

## 2020-07-27 NOTE — Progress Notes (Signed)
I agree with the above plan 

## 2020-07-27 NOTE — Patient Instructions (Signed)
Continue aspirin 325 mg daily and clopidogrel 75 mg daily  and atorvastatin for secondary stroke prevention  Follow-up with Dr. Corliss Skains with repeat imaging around 10/2020 as well as ongoing duration of both aspirin and Plavix  Continue to follow up with PCP regarding cholesterol and blood pressure management  Maintain strict control of hypertension with blood pressure goal between 130-150, diabetes with hemoglobin A1c goal below 6.5% and cholesterol with LDL cholesterol (bad cholesterol) goal below 70 mg/dL.       Followup in the future with me in 6 months or call earlier if needed       Thank you for coming to see [place] at [facility]. I hope we have been able to provide you high quality care today.  You may receive a patient satisfaction survey over the next few weeks. We would appreciate your feedback and comments so that we may continue to improve ourselves and the health of our patients.

## 2020-09-14 ENCOUNTER — Telehealth: Payer: Self-pay | Admitting: Student

## 2020-09-14 ENCOUNTER — Encounter: Payer: Self-pay | Admitting: Adult Health

## 2020-09-14 NOTE — Telephone Encounter (Signed)
Rx for Plavix 75 mg PO daily with 2 refills called into patient's preferred pharmacy.  Loyce Dys, MS RD PA-C

## 2020-11-21 ENCOUNTER — Other Ambulatory Visit (HOSPITAL_COMMUNITY): Payer: Self-pay | Admitting: Interventional Radiology

## 2020-11-21 DIAGNOSIS — I671 Cerebral aneurysm, nonruptured: Secondary | ICD-10-CM

## 2020-11-24 ENCOUNTER — Ambulatory Visit (HOSPITAL_COMMUNITY)
Admission: RE | Admit: 2020-11-24 | Discharge: 2020-11-24 | Disposition: A | Discharge status: Home / Self Care | Payer: Self-pay | Source: Ambulatory Visit | Attending: Interventional Radiology | Admitting: Interventional Radiology

## 2020-11-24 ENCOUNTER — Ambulatory Visit (HOSPITAL_COMMUNITY): Admission: RE | Admit: 2020-11-24 | Payer: Self-pay | Source: Ambulatory Visit

## 2020-11-24 ENCOUNTER — Telehealth (HOSPITAL_COMMUNITY): Payer: Self-pay | Admitting: Radiology

## 2020-11-24 ENCOUNTER — Other Ambulatory Visit: Payer: Self-pay

## 2020-11-24 DIAGNOSIS — I671 Cerebral aneurysm, nonruptured: Secondary | ICD-10-CM | POA: Insufficient documentation

## 2020-11-24 LAB — POCT I-STAT CREATININE: Creatinine, Ser: 0.6 mg/dL (ref 0.44–1.00)

## 2020-11-24 MED ORDER — IOHEXOL 350 MG/ML SOLN
80.0000 mL | Freq: Once | INTRAVENOUS | Status: AC | PRN
  Administered 2020-11-24: 80 mL via INTRAVENOUS

## 2020-11-24 NOTE — Telephone Encounter (Signed)
Called pt's daughter, left VM that per Deveshwar pt will have follow-up CTA w in 6 months. JM

## 2021-01-29 ENCOUNTER — Encounter: Payer: Self-pay | Admitting: Adult Health

## 2021-01-29 ENCOUNTER — Ambulatory Visit: Payer: Self-pay | Admitting: Adult Health

## 2021-01-29 ENCOUNTER — Other Ambulatory Visit: Payer: Self-pay

## 2021-01-29 VITALS — BP 167/90 | HR 69 | Ht 62.0 in | Wt 145.0 lb

## 2021-01-29 DIAGNOSIS — I639 Cerebral infarction, unspecified: Secondary | ICD-10-CM

## 2021-01-29 NOTE — Progress Notes (Signed)
[facility] [address] [place]. [place] [address] [phone]       STROKE FOLLOW UP NOTE  [name] Date of Birth:  1946-04-30 Medical Record Number:  [id number]   Reason for Referral: stroke follow up    SUBJECTIVE:   CHIEF COMPLAINT:  Chief Complaint  Patient presents with  . Follow-up    RM 14 with daughter (Andrea Maxwell) Pt is well, no symptoms     HPI:   Today, 01/29/2021, [name] returns for 36-month stroke follow-up accompanied by her daughter.  She has been stable since prior visit without new recurrent stroke/TIA symptoms and denies residual deficits.    Remains on aspirin 325 mg daily and atorvastatin for secondary stroke prevention without side effects.   Blood pressure today elevated on metoprolol 25 mg twice daily. Per daughter, PCP d/c'd lisinopril 20 mg daily back in December due to hypotension. BP stable in January off lisinopril and has not recently been monitoring at home.  BP initially 179/97, recheck 167/90 and recheck after 15 min 168/96 -asymptomatic.  Repeat CTA head/neck 11/2020 stable appearance with plans on repeating CTA around 05/2021 per Dr. Corliss Skains.  Per daughter, she plans on returning to [place] next month and hopefully be able to return by June or July but this may be delayed based on pandemic at that time     History provided for reference purposes only Initial visit 07/27/2020 JM: [name] is being seen for hospital follow-up accompanied by her daughter. 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 Corliss Skains.  Advised to continue DAPT by Dr. Corliss Skains until repeat imaging in 10/2019 Continues on aspirin and Plavix as well as  atorvastatin without side effects Blood pressure today 131/76 No concerns at this time  Stroke admission June 12, 2020 [name] is a 75 y.o. 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 CTA head w/ multifocal stenosis including high-grade BA stenosis.  Neuro worsening in hospital on 06/14/2020. Concern for VB insufficiency given altered responsiveness. Transferred to [facility] [facility] for further evaluation.  Stroke work-up revealed infarct likely secondary to large vessel disease source with preocclusive BA stenosis.  Evaluated by Dr. Corliss Skains with planned intervention the following week.  Recommended DAPT for 76months and then aspirin alone.  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  HTN stable and recommended long-term BP goal 130-150.  LDL 99 and initiate atorvastatin 80 mg daily.  Evaluated by therapy and discharged home in stable condition.  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   Repeat CT head [facility]) no acute abnormality  CTA head & neck [facility]) multifocal intracranial stenoses-more predominantly in the posterior circulation with the proximal basilar high-grade focal stenosis. right PCA P1 P2 segment high-grade stenosis. moderate to severe left M2 stenosis.  MRI  [facility]) Small acute to subacute small R cerebellar infarct   Carotid Doppler  [facility]) heterogeneous minimal plaque in extracranial circulation.  Anterograde vertebral flow  2D Echo [facility] sinus rhythm, normal LV function, no LV thrombus  and no defect in the interatrial or interventricular septum.   LDL 99  HgbA1c 5.9  Lovenox 40 mg sq daily for VTE prophylaxis  No antithrombotic prior to [facility] admission, now on aspirin 325 mg daily and clopidogrel 75 mg daily after loading dose. Continue DAPT x 3 months then aspirin alone.    Therapy recommendations:  No PT  Disposition:  home    ROS:   14 system review of systems performed and negative with exception of no concerns  PMH:  Past Medical History:  Diagnosis Date  . Arthritis   . Hypertension   . [facility]    PSH:  Past Surgical History:  Procedure Laterality Date  . IR ANGIO INTRA EXTRACRAN SEL COM CAROTID INNOMINATE BILAT MOD SED  06/15/2020  . IR ANGIO VERTEBRAL SEL SUBCLAVIAN INNOMINATE UNI L MOD SED  06/15/2020  . IR ANGIO VERTEBRAL SEL VERTEBRAL UNI R MOD SED  06/21/2020  . IR INTRA CRAN STENT  06/20/2020  . IR US GUIDE VASC ACCESS RIGHT  06/15/2020  . IR US GUIDE VASC ACCESS RIGHT  06/20/2020  . NO PAST SURGERIES    . RADIOLOGY WITH ANESTHESIA N/A 06/20/2020   Procedure: cerebral angioplasty with stenting;  Surgeon: Julieanne Cottoneveshwar, Sanjeev, MD;  [facility];  Service: Radiology;  Laterality: N/A;    Social History:  Social History   Socioeconomic History  . Marital status: Married    Spouse name: Not on file  . Number of children: Not on file  . Years of education: Not on file  . Highest education level: Not on file  Occupational History  . Not on file  Tobacco Use  . Smoking status: Never Smoker  . Smokeless tobacco: Never Used  Vaping Use  . Vaping Use: Never used  Substance and Sexual Activity  . Alcohol use: Never  . Drug use: Never  . Sexual activity: Not on file  Other Topics Concern  . Not on file  Social History Narrative  . Not on file   Social Determinants of Health   Financial Resource Strain: Not on file  Food Insecurity: Not on file  Transportation Needs: Not on file  Physical Activity: Not on file   Stress: Not on file  Social Connections: Not on file  Intimate Partner Violence: Not on file    Family History:  Family History  Problem Relation Age of Onset  . Hypertension Sister     Medications:   Current Outpatient Medications on File Prior to Visit  Medication Sig Dispense Refill  . aspirin EC 325 MG EC tablet Take 1 tablet (325 mg total) by mouth daily. 30 tablet 3  . atorvastatin (LIPITOR) 80 MG tablet Take 1 tablet (80 mg total) by mouth daily. 30 tablet 3  . lisinopril (ZESTRIL) 20 MG tablet Take 1 tablet (20 mg total) by mouth daily. 90 tablet 0  . metoprolol tartrate (LOPRESSOR) 25 MG tablet Take 25 mg by mouth 2 (two) times daily.    . Omega-3 Fatty Acids (FISH OIL PO) Take 1 capsule by mouth in the morning and at bedtime.     No current facility-administered medications on file prior to visit.    Allergies:  No Known Allergies    OBJECTIVE:  Physical Exam  Vitals:   01/29/21 1532  Weight: 145 lb (65.8 kg)  Height: 5\' 2"  (1.575 m)   Body mass index  is 26.52 kg/m. No exam data present  General: well developed, well nourished,  very pleasant elderly female, seated, in no evident distress Head: head normocephalic and atraumatic.   Neck: supple with no carotid or supraclavicular bruits Cardiovascular: regular rate and rhythm, no murmurs Musculoskeletal: no deformity Skin:  no rash/petichiae Vascular:  Normal pulses all extremities   Neurologic Exam Mental Status: Awake and fully alert.   No dysarthria or aphasia.  Oriented to place and time. Recent and remote memory intact. Attention span, concentration and fund of knowledge appropriate. Mood and affect appropriate.  Cranial Nerves: Pupils equal, briskly reactive to light. Extraocular movements full without nystagmus. Visual fields full to confrontation. Hearing intact. Facial sensation intact. Face, tongue, palate moves normally and symmetrically.  Motor: Normal bulk and tone. Normal strength in all  tested extremity muscles. Sensory.: intact to touch , pinprick , position and vibratory sensation.  Coordination: Rapid alternating movements normal in all extremities. Finger-to-nose and heel-to-shin performed accurately bilaterally. Gait and Station: Arises from chair without difficulty. Stance is normal. Gait demonstrates normal stride length and balance without use of assistive device. Reflexes: 1+ and symmetric. Toes downgoing.        ASSESSMENT: Jerald Villalona is a 75 y.o. year old female presented to [facility] on 06/04/2020 with periorbital numbness, staring spells and decreased LOC found to have a right cerebellar infarct with neuro worsening during admission on 06/14/2020 therefore transferred to [facility] due to concern of VB insufficiency.  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 Corliss Skains.      PLAN:  1. Right cerebellar stroke:  a. Recovered well without residual deficit  b. continue aspirin 325 mg daily  and atorvastatin 80 mg daily for secondary stroke prevention.  c. Discussed secondary stroke prevention measures and importance of close PCP follow up for aggressive stroke risk factor management  2. BA stenosis s/p stent: a. CTA head/neck 11/2020 stable compared to prior imaging with plans on repeating CTA around 05/2021 i. IMPRESSION 11/2020:   1. Status post placement of a proximal basilar artery stent. Contrast opacification of the basilar artery distal to the stent is noted with    luminal irregularity and moderate stenosis just proximal to the origin of the bilateral superior cerebellar arteries.   2. The P1 segment of the right posterior cerebral artery is not opacified with faint opacification noted from a posterior communicating    artery. The P2 segment is diffusely stenotic with attenuation of distal branches, similar to  prior exam.   3. Moderate stenosis of the proximal left M2/MCA inferior division.   4. 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   5. Remote bilateral cerebellar infarcts.   6. Aortic atherosclerosis. b. Discussed importance of ongoing use of aspirin and statin as well as aggressive management stroke risk factors  c. followed by Dr. Corliss Skains 3. HTN:  a. BP goal 130-150.  Elevated today on metoprolol per PCP - d/c'd lisinopril per PCP around Dec d/t hypotension.  Advised to monitor with recommended BP goal and if remains elevated, f/u with PCP for possible need of restarting lisinopril but possibly a lower dosage to avoid hypotension 4. HLD:  a. LDL goal <70.  Reports recent lipid panel with PCP on atorvastatin 80 mg daily.  Request lab work be faxed to office as unable to see recent lab work via epic  Follow up in 6 months or call earlier if needed   CC:  [facility] provider: Dr. Alfred Levins, Rulon Sera, FNP    I spent 30 minutes of face-to-face and non-face-to-face time with patient and daughter.  This included previsit chart review, lab review, study review, order entry, electronic health record documentation, patient education regarding prior stroke, routine follow-up with Dr. Corliss Skains for BA stenosis s/p stent, importance of managing stroke risk factors and answered all otherquestions to patient satisfaction   Ihor Austin, AGNP-BC  [facility] [address] [place], [place] [address]  Phone [phone] Fax [phone] Note: This document was prepared with digital dictation and possible smart phrase technology. Any transcriptional errors that result from this process are unintentional.

## 2021-01-29 NOTE — Patient Instructions (Signed)
Continue aspirin 325 mg daily  and atorvastatin 80 mg daily for secondary stroke prevention  Ensure follow-up with Dr. Corliss Skains to repeat imaging around June for surveillance monitoring  Continue to follow up with PCP regarding cholesterol and blood pressure management  Maintain strict control of hypertension with blood pressure goal below 130/90 and cholesterol with LDL cholesterol (bad cholesterol) goal below 70 mg/dL.      Followup in the future with me in 6 months or call earlier if needed     Thank you for coming to see [place] at [facility]. I hope we have been able to provide you high quality care today.  You may receive a patient satisfaction survey over the next few weeks. We would appreciate your feedback and comments so that we may continue to improve ourselves and the health of our patients.

## 2021-01-30 NOTE — Progress Notes (Signed)
I agree with the above plan 

## 2021-03-14 IMAGING — CT CT ANGIO HEAD
2 of 11 series · 7 of 35 positions shown · IV contrast (omnipaque)
Comparison: CT angiogram of the head and neck June 13, 2020.

CLINICAL DATA: Brain aneurysm.

Prior history of basilar artery stenosis status post stenting.
EXAM:
CT ANGIOGRAPHY HEAD AND NECK
TECHNIQUE: Multidetector CT imaging of the head and neck was performed using
the standard protocol during bolus administration of intravenous
contrast. Multiplanar CT image reconstructions and MIPs were
obtained to evaluate the vascular anatomy. Carotid stenosis
measurements (when applicable) are obtained utilizing NASCET
criteria, using the distal internal carotid diameter as the
denominator.
CONTRAST:  80mL OMNIPAQUE IOHEXOL 350 MG/ML SOLN

[Series 7: cta neck · axial · 0.42mm/px · z∈[-184,-68]mm · 2 of 174 slices shown]
[im 58/174  soft-tissue]
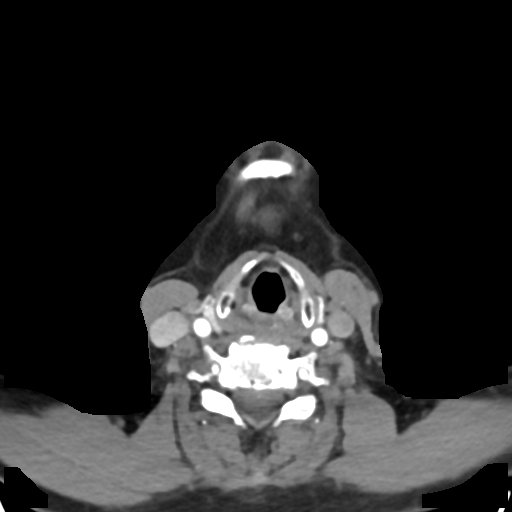
[im 116/174  soft-tissue]
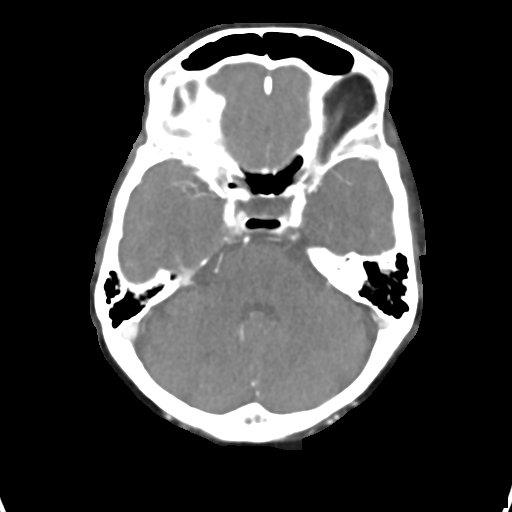

[Series 9: cta neck axial · axial · 0.39mm/px · z∈[-250,-22]mm · 5 of 345 slices shown]
[im 58/345  soft-tissue]
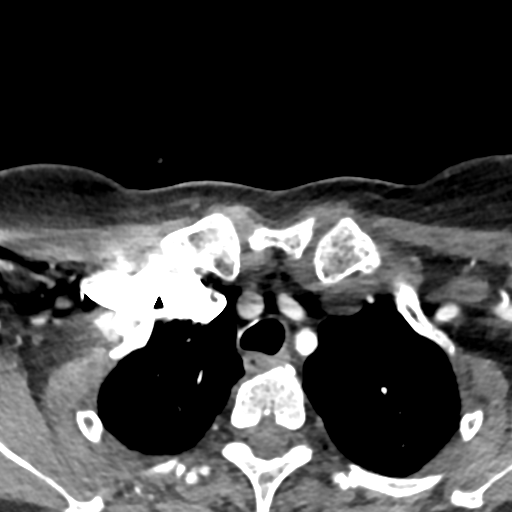
[im 115/345  bone]
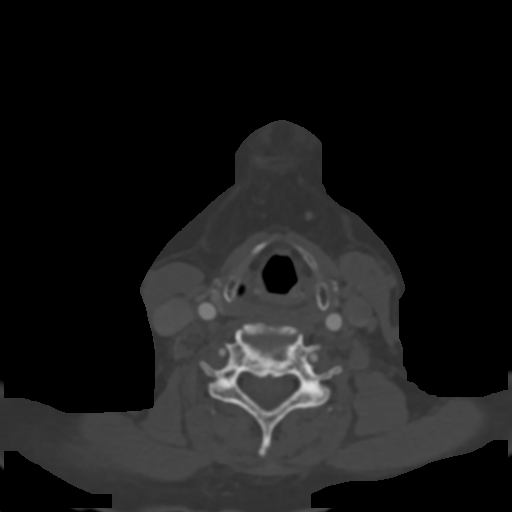
[im 173/345  soft-tissue]
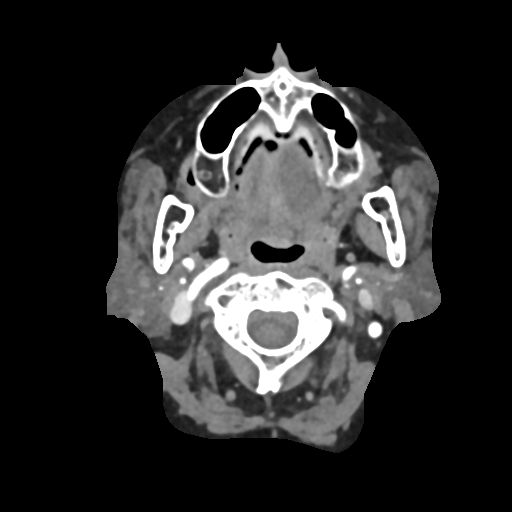
[im 230/345  bone]
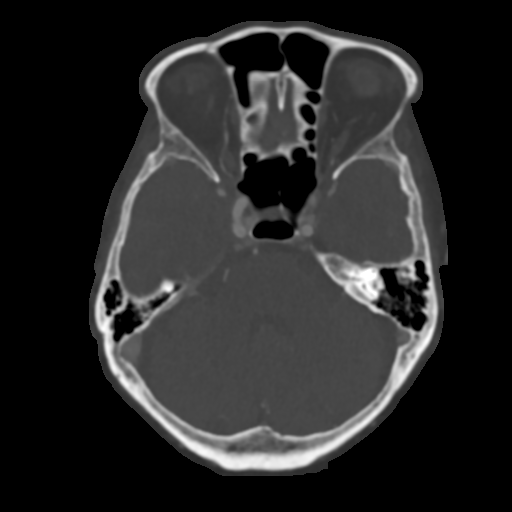
[im 287/345  soft-tissue]
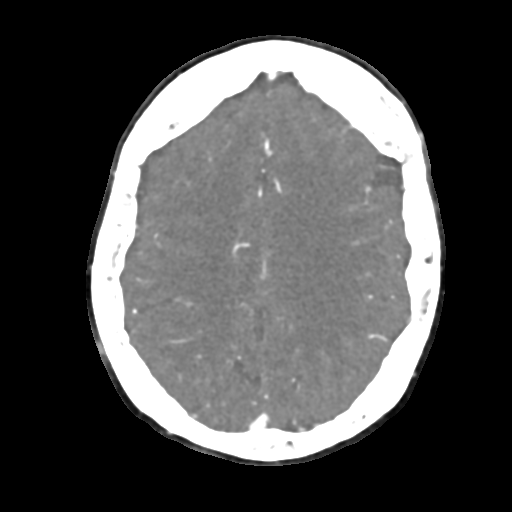

[7 of 35 positions shown; findings below may reference images not displayed]

FINDINGS: CT HEAD FINDINGS

Brain: No evidence of acute infarction, hemorrhage, hydrocephalus,
extra-axial collection or mass lesion/mass effect. Remote infarct in
the bilateral cerebellar hemispheres.

Vascular: No hyperdense vessel.

Skull: Normal. Negative for fracture or focal lesion.

Sinuses: Imaged portions are clear.

Orbits: No acute finding.

Review of the MIP images confirms the above findings

CTA NECK FINDINGS

Aortic arch: Standard branching. Imaged portion shows no evidence of
aneurysm or dissection. Calcified plaques are seen in the aortic
arch. No significant stenosis of the major arch vessel origins.

Right carotid system: Mild atherosclerotic changes in the right
carotid bifurcation. No evidence of dissection, stenosis (50% or
greater) or occlusion.

Left carotid system: Mild atherosclerotic changes in the left
carotid bifurcation no evidence of dissection, stenosis (50% or
greater) or occlusion.

Vertebral arteries: Left dominant. No evidence of dissection,
stenosis (50% or greater) or occlusion.

Skeleton: No acute finding or aggressive bone lesion identified.

Other neck: Prominence of the main salivary duct the bilateral
submandibular glands. No sialolith identified.

Upper chest:  Negative.

Review of the MIP images confirms the above findings

CTA HEAD FINDINGS

Anterior circulation: Mild atherosclerotic changes with calcified
plaques in the bilateral carotid siphons without hemodynamically
significant stenosis. Luminal irregularity along the left MCA
vascular tree with moderate stenosis at the proximal left M2/MCA
inferior division. The right MCA and bilateral ACAs are maintained.

Posterior circulation: Calcified plaque in the right vertebral
artery with moderate stenosis. The dominant left vertebral artery is
maintained. Status post placement of a stent in the proximal basilar
artery. Contrast opacification of the basilar artery distal to the
stent is noted with luminal irregularity and moderate stenosis just
proximal to the origin of the bilateral superior cerebellar
arteries. The left posterior artery origin is directly from the left
ICA (fetal PCA) with mild luminal irregularity without high-grade
stenosis. The P1 segment of the right posterior cerebral artery is
not opacified with faint opacification noted from a posterior
communicating artery. The P2 segment is diffusely stenotic with
attenuation of distal branches, similar to prior.

Venous sinuses: As permitted by contrast timing, patent.

Anatomic variants: Bihemispheric left PICA.

Review of the MIP images confirms the above findings
IMPRESSION: 1. Status post placement of a proximal basilar artery stent.
Contrast opacification of the basilar artery distal to the stent is
noted with luminal irregularity and moderate stenosis just proximal
to the origin of the bilateral superior cerebellar arteries.
2. The P1 segment of the right posterior cerebral artery is not
opacified with faint opacification noted from a posterior
communicating artery. The P2 segment is diffusely stenotic with
attenuation of distal branches, similar to prior exam.
3. Moderate stenosis of the proximal left M2/MCA inferior division.
4. Calcified plaque in the right vertebral artery with moderate
stenosis.
5. Remote bilateral cerebellar infarcts.
6. Aortic atherosclerosis.

Aortic Atherosclerosis ([id number].X).

## 2021-07-30 ENCOUNTER — Ambulatory Visit: Payer: Self-pay | Admitting: Adult Health

## 2021-08-31 ENCOUNTER — Telehealth (HOSPITAL_COMMUNITY): Payer: Self-pay

## 2021-08-31 NOTE — Telephone Encounter (Signed)
Spoke to pt's daughter. Ms. Borgwardt is back in the [place] and won't be back until about March. Will check back with them when she returns. AW

## 2022-12-05 ENCOUNTER — Ambulatory Visit (INDEPENDENT_AMBULATORY_CARE_PROVIDER_SITE_OTHER): Payer: Self-pay | Admitting: Adult Health

## 2022-12-05 ENCOUNTER — Encounter: Payer: Self-pay | Admitting: Adult Health

## 2022-12-05 ENCOUNTER — Telehealth: Payer: Self-pay | Admitting: Adult Health

## 2022-12-05 VITALS — BP 142/92 | HR 86 | Ht 60.0 in | Wt 140.2 lb

## 2022-12-05 DIAGNOSIS — I651 Occlusion and stenosis of basilar artery: Secondary | ICD-10-CM

## 2022-12-05 DIAGNOSIS — I639 Cerebral infarction, unspecified: Secondary | ICD-10-CM

## 2022-12-05 MED ORDER — ATORVASTATIN CALCIUM 80 MG PO TABS: 80.0000 mg | ORAL_TABLET | Freq: Every day | ORAL | 3 refills | Status: AC

## 2022-12-05 MED ORDER — ASPIRIN 325 MG PO TBEC: 325.0000 mg | DELAYED_RELEASE_TABLET | Freq: Every day | ORAL | 3 refills | Status: AC

## 2022-12-05 NOTE — Telephone Encounter (Signed)
self-pay sent to [facility] [phone]

## 2022-12-05 NOTE — Progress Notes (Signed)
140.2lbs 5'0"

## 2022-12-05 NOTE — Patient Instructions (Addendum)
Continue aspirin 325 mg daily  and atorvastatin for secondary stroke prevention Once you return back to the [place] please ensure you continue on aspirin and some form of statin as these are recommended life long for stroke prevention   You will be called to schedule imaging of your head to follow up on your basilar artery stent  Continue to follow up with PCP regarding blood pressure and cholesterol management  Maintain strict control of hypertension with blood pressure goal below 130/90 and cholesterol with LDL cholesterol (bad cholesterol) goal below 70 mg/dL.   Signs of a Stroke? Follow the BEFAST method:  Balance Watch for a sudden loss of balance, trouble with coordination or vertigo Eyes Is there a sudden loss of vision in one or both eyes? Or double vision?  Face: Ask the person to smile. Does one side of the face droop or is it numb?  Arms: Ask the person to raise both arms. Does one arm drift downward? Is there weakness or numbness of a leg? Speech: Ask the person to repeat a simple phrase. Does the speech sound slurred/strange? Is the person confused ? Time: If you observe any of these signs, call 911.       Thank you for coming to see [place] at [facility]. I hope we have been able to provide you high quality care today.  You may receive a patient satisfaction survey over the next few weeks. We would appreciate your feedback and comments so that we may continue to improve ourselves and the health of our patients.

## 2022-12-05 NOTE — Progress Notes (Signed)
[facility] [address] [place] [id number] [phone]       STROKE FOLLOW UP NOTE  Andrea Maxwell Date of Birth:  1946/06/03 Medical Record Number:  [id number]   Reason for Referral: stroke follow up    SUBJECTIVE:   CHIEF COMPLAINT:  Chief Complaint  Patient presents with   Room 2    Pt is here with her Daughter. Pt's daughter said that everything seems ok. Pt's daughter states that her balance and walking are good. Pt's daughter states no headaches.     HPI:   Update 12/05/2022 JM: Patient returns for stroke follow-up after prior visit almost 2 years ago as she just returned back from the [place] about 2 weeks ago.  She is accompanied by her daughter.  Overall stable since prior visit.  Denies new or reoccurring stroke/TIA symptoms.  All medications were stopped while in [place] as she had difficulty getting refills. Blood pressure typically well controlled at home, around 130-140s/80s.  She has f/u visit coming up with PCP next month. Plans on returning back to the [place] end of January for about 1 year duration.    History provided for reference purposes only Update 01/29/2021 JM: Andrea Maxwell returns for 8138-month stroke follow-up accompanied by her daughter.  She has been stable since prior visit without new recurrent stroke/TIA symptoms and denies residual deficits.    Remains on aspirin 325 mg daily and atorvastatin for secondary stroke prevention without side effects.   Blood pressure today elevated on metoprolol 25 mg twice daily. Per daughter, PCP d/c'd lisinopril 20 mg daily back in December due to hypotension. BP stable in January off lisinopril and has not recently been monitoring at home.  BP initially 179/97, recheck 167/90 and recheck after 15 min 168/96 -asymptomatic.  Repeat CTA head/neck 11/2020 stable appearance with plans on repeating CTA around 05/2021 per Dr. Corliss Skainseveshwar.  Per daughter, she plans on  returning to [place] next month and hopefully be able to return by June or July but this may be delayed based on pandemic at that time   Initial visit 07/27/2020 JM: Andrea Maxwell is being seen for hospital follow-up accompanied by her daughter. 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 Corliss Skainseveshwar.  Advised to continue DAPT by Dr. Corliss Skainseveshwar until repeat imaging in 10/2019 Continues on aspirin and Plavix as well as atorvastatin without side effects Blood pressure today 131/76 No concerns at this time  Stroke admission June 12, 2020 Andrea Maxwell is a 76 y.o. female with history of hypertension, prediabetes, admitted [facility] 06/12/20 w/ periorbital numbness, staring spells and decreased LOC, similar to episode 1 yr ago - found to have R cerebellar infarct, put on plavix, statin and Keppra for possible sz. CTA head w/ multifocal stenosis including high-grade BA stenosis.  Neuro worsening in hospital on 06/14/2020. Concern for VB insufficiency given altered responsiveness. [facility].  Stroke work-up revealed infarct likely secondary to large vessel disease source with preocclusive BA stenosis.  Evaluated by Dr. Corliss Skainseveshwar with planned intervention the following week.  Recommended DAPT for 3months and then aspirin alone.  Questionable seizure activity at [facility] and likely due to BV insufficiency therefore Keppra discontinued.  HTN stable and recommended long-term  BP goal 130-150.  LDL 99 and initiate atorvastatin 80 mg daily.  Evaluated by therapy and discharged home in stable condition.  Stroke:   R small cerebellar infarct likely large vessel  disease source with pre-occlusive BA stenosis CT head [facility]) No acute abnormality.  Repeat CT head [facility]) no acute abnormality CTA head & neck [facility]) multifocal intracranial stenoses-more predominantly in the posterior circulation with the proximal basilar high-grade focal stenosis.  right PCA P1 P2 segment high-grade stenosis.  moderate to severe left M2 stenosis. MRI  [facility]) Small acute to subacute small R cerebellar infarct  Carotid Doppler  [facility]) heterogeneous minimal plaque in extracranial circulation.  Anterograde vertebral flow 2D Echo [facility]) sinus rhythm, normal LV function, no LV thrombus and no defect in the interatrial or interventricular septum.  LDL 99 HgbA1c 5.9 Lovenox 40 mg sq daily for VTE prophylaxis No antithrombotic prior to [facility] admission, now on aspirin 325 mg daily and clopidogrel 75 mg daily after loading dose. Continue DAPT x 3 months then aspirin alone.   Therapy recommendations:  No PT Disposition:  home    ROS:   14 system review of systems performed and negative with exception of no concerns  PMH:  Past Medical History:  Diagnosis Date   Arthritis    Hypertension    Stroke (HCC) 05/23/2020    PSH:  Past Surgical History:  Procedure Laterality Date   IR ANGIO INTRA EXTRACRAN SEL COM CAROTID INNOMINATE BILAT MOD SED  06/15/2020   IR ANGIO VERTEBRAL SEL SUBCLAVIAN INNOMINATE UNI L MOD SED  06/15/2020   IR ANGIO VERTEBRAL SEL VERTEBRAL UNI R MOD SED  06/21/2020   IR INTRA CRAN STENT  06/20/2020   IR US GUIDE VASC ACCESS RIGHT  06/15/2020   IR US GUIDE VASC ACCESS RIGHT  06/20/2020   NO PAST SURGERIES     RADIOLOGY WITH ANESTHESIA N/A 06/20/2020   Procedure: cerebral angioplasty with stenting;  Surgeon: Julieanne Cotton, MD;  Location: [facility] OR;  Service: Radiology;  Laterality: N/A;    Social History:  Social History   Socioeconomic History   Marital status: Married     Spouse name: Not on file   Number of children: Not on file   Years of education: Not on file   Highest education level: Not on file  Occupational History   Not on file  Tobacco Use   Smoking status: Never   Smokeless tobacco: Never  Vaping Use   Vaping Use: Never used  Substance and Sexual Activity   Alcohol use: Never   Drug use: Never   Sexual activity: Not on file  Other Topics Concern   Not on file  Social History Narrative   Not on file   Social Determinants of Health   Financial Resource Strain: Not on file  Food Insecurity: Not on file  Transportation Needs: Not on file  Physical Activity: Not on file  Stress: Not on file  Social Connections: Not on file  Intimate Partner Violence: Not on file    Family History:  Family History  Problem Relation Age of Onset   Hypertension Sister     Medications:   Current Outpatient Medications on File Prior to Visit  Medication Sig Dispense Refill   lisinopril (ZESTRIL) 20 MG tablet Take 1 tablet (20 mg total) by mouth daily. (Patient not taking: Reported on 12/05/2022) 90 tablet 0   metoprolol tartrate (LOPRESSOR) 25 MG tablet Take 25 mg by mouth 2 (two)  times daily. (Patient not taking: Reported on 12/05/2022)     Omega-3 Fatty Acids (FISH OIL PO) Take 1 capsule by mouth in the morning and at bedtime. (Patient not taking: Reported on 12/05/2022)     No current facility-administered medications on file prior to visit.    Allergies:  No Known Allergies    OBJECTIVE:  Physical Exam  Vitals:   12/05/22 1256  BP: (!) 142/92  Pulse: 86  Weight: 140 lb 3.2 oz (63.6 kg)  Height: 5' (1.524 m)   Body mass index is 27.38 kg/m. No results found.  General: well developed, well nourished,  very pleasant elderly female, seated, in no evident distress Head: head normocephalic and atraumatic.   Neck: supple with no carotid or supraclavicular bruits Cardiovascular: regular rate and rhythm, no murmurs Musculoskeletal: no  deformity Skin:  no rash/petichiae Vascular:  Normal pulses all extremities   Neurologic Exam Mental Status: Awake and fully alert.   No dysarthria or aphasia.  Oriented to place and time. Recent and remote memory intact. Attention span, concentration and fund of knowledge appropriate. Mood and affect appropriate.  Cranial Nerves: Pupils equal, briskly reactive to light. Extraocular movements full without nystagmus. Visual fields full to confrontation. Hearing intact. Facial sensation intact. Face, tongue, palate moves normally and symmetrically.  Motor: Normal bulk and tone. Normal strength in all tested extremity muscles. Sensory.: intact to touch , pinprick , position and vibratory sensation.  Coordination: Rapid alternating movements normal in all extremities. Finger-to-nose and heel-to-shin performed accurately bilaterally. Gait and Station: Arises from chair without difficulty. Stance is normal. Gait demonstrates normal stride length and balance without use of assistive device. Reflexes: 1+ and symmetric. Toes downgoing.        ASSESSMENT: Andrea Maxwell is a 76 y.o. year old female presented to [facility] on 06/04/2020 with periorbital numbness, staring spells and decreased LOC found to have a right cerebellar infarct with neuro worsening during admission on 06/14/2020 therefore transferred to [facility] due to concern of VB insufficiency.  Discharged home and return to [facility] on 06/20/2020 for image guided cerebral arteriogram with revascularization of proximal basilar artery stenosis using stent assisted angioplasty by Dr. Corliss Skains.      PLAN:  Right cerebellar stroke:  Recovered well without residual deficit  Recommend restarting aspirin 325 mg daily  and atorvastatin 80 mg daily for secondary stroke prevention as these were stopped while in the [place] She plans on returning back to [place] next month, discussed importance of continued daily use of these medications  for secondary stroke prevention Discussed secondary stroke prevention measures and importance of close PCP follow up for aggressive stroke risk factor management including BP goal<130/90, and HLD with LDL goal<70  She is currently self-pay and would like to hold off on any lab work at this time. Provided daughter with goodrx card. Has f/u with PCP in a couple weeks.   BA stenosis s/p stent: Recommend completing CTA head for follow-up, will send back to Dr. Corliss Skains with any concerning findings Advised daughter that she can ask price of imaging when they call to schedule    Overall stable from stroke standpoint and risk factors are managed by PCP. She may follow up PRN, as usual for our patients who are strictly being followed for stroke. If any new neurological issues should arise, request PCP place referral for evaluation by one of our neurologists. Thank you.     CC:  [facility] provider: Dr. Alfred Levins, Rulon Sera, FNP (Inactive)  I spent 26 minutes of face-to-face and non-face-to-face time with patient and daughter.  This included previsit chart review, lab review, study review, order entry, electronic health record documentation, patient education and discussion regarding above diagnoses and treatment plan and answered all the questions to patient and daughter satisfaction   Ihor Austin, [facility]  [facility] [address] [place], [place] [address]  Phone [phone] Fax [phone] Note: This document was prepared with digital dictation and possible smart phrase technology. Any transcriptional errors that result from this process are unintentional.

## 2023-01-29 ENCOUNTER — Ambulatory Visit: Payer: Self-pay | Admitting: Adult Health

## 2024-06-12 ENCOUNTER — Encounter (HOSPITAL_COMMUNITY): Payer: Self-pay | Admitting: Interventional Radiology
